# Patient Record
Sex: Male | Born: 1955 | Race: Black or African American | Hispanic: No | Marital: Married | Smoking: Former smoker
Health system: Southern US, Community
[De-identification: ages and names within clinical notes are randomized; demographics above are authoritative.]

## PROBLEM LIST (undated history)

## (undated) DIAGNOSIS — F32A Depression, unspecified: Secondary | ICD-10-CM

## (undated) DIAGNOSIS — I5022 Chronic systolic (congestive) heart failure: Secondary | ICD-10-CM

## (undated) DIAGNOSIS — E782 Mixed hyperlipidemia: Secondary | ICD-10-CM

## (undated) DIAGNOSIS — I10 Essential (primary) hypertension: Secondary | ICD-10-CM

## (undated) DIAGNOSIS — I509 Heart failure, unspecified: Secondary | ICD-10-CM

## (undated) DIAGNOSIS — E041 Nontoxic single thyroid nodule: Secondary | ICD-10-CM

## (undated) DIAGNOSIS — C9590 Leukemia, unspecified not having achieved remission: Secondary | ICD-10-CM

## (undated) DIAGNOSIS — I428 Other cardiomyopathies: Secondary | ICD-10-CM

## (undated) HISTORY — DX: Other cardiomyopathies: I42.8

## (undated) HISTORY — PX: BACK SURGERY: SHX140

## (undated) HISTORY — DX: Nontoxic single thyroid nodule: E04.1

## (undated) HISTORY — DX: Chronic systolic (congestive) heart failure: I50.22

## (undated) HISTORY — DX: Mixed hyperlipidemia: E78.2

---

## 2000-03-17 ENCOUNTER — Emergency Department (HOSPITAL_COMMUNITY): Admission: EM | Admit: 2000-03-17 | Discharge: 2000-03-18 | Payer: Self-pay | Admitting: Emergency Medicine

## 2000-03-17 ENCOUNTER — Encounter: Payer: Self-pay | Admitting: Emergency Medicine

## 2007-08-05 ENCOUNTER — Ambulatory Visit: Payer: Self-pay | Admitting: Infectious Diseases

## 2007-08-05 ENCOUNTER — Inpatient Hospital Stay (HOSPITAL_COMMUNITY): Admission: EM | Admit: 2007-08-05 | Discharge: 2007-08-08 | Payer: Self-pay | Admitting: Emergency Medicine

## 2007-08-06 ENCOUNTER — Encounter (INDEPENDENT_AMBULATORY_CARE_PROVIDER_SITE_OTHER): Payer: Self-pay | Admitting: Gastroenterology

## 2008-10-06 ENCOUNTER — Ambulatory Visit: Payer: Self-pay | Admitting: Cardiology

## 2008-10-07 ENCOUNTER — Inpatient Hospital Stay (HOSPITAL_COMMUNITY): Admission: EM | Admit: 2008-10-07 | Discharge: 2008-10-08 | Payer: Self-pay | Admitting: Emergency Medicine

## 2008-10-07 ENCOUNTER — Encounter (INDEPENDENT_AMBULATORY_CARE_PROVIDER_SITE_OTHER): Payer: Self-pay | Admitting: Internal Medicine

## 2008-10-08 ENCOUNTER — Emergency Department (HOSPITAL_COMMUNITY): Admission: EM | Admit: 2008-10-08 | Discharge: 2008-10-08 | Payer: Self-pay | Admitting: Emergency Medicine

## 2009-02-03 ENCOUNTER — Inpatient Hospital Stay (HOSPITAL_COMMUNITY): Admission: EM | Admit: 2009-02-03 | Discharge: 2009-02-14 | Payer: Self-pay | Admitting: Emergency Medicine

## 2009-02-09 ENCOUNTER — Encounter (INDEPENDENT_AMBULATORY_CARE_PROVIDER_SITE_OTHER): Payer: Self-pay | Admitting: General Surgery

## 2009-12-25 ENCOUNTER — Emergency Department (HOSPITAL_COMMUNITY): Admission: EM | Admit: 2009-12-25 | Discharge: 2009-12-25 | Payer: Self-pay | Admitting: Emergency Medicine

## 2010-10-01 LAB — CBC
Hemoglobin: 14.5 g/dL (ref 13.0–17.0)
MCHC: 34.4 g/dL (ref 30.0–36.0)
RBC: 4.63 MIL/uL (ref 4.22–5.81)
WBC: 9.4 10*3/uL (ref 4.0–10.5)

## 2010-10-01 LAB — URINALYSIS, ROUTINE W REFLEX MICROSCOPIC
Bilirubin Urine: NEGATIVE
Glucose, UA: NEGATIVE mg/dL
Hgb urine dipstick: NEGATIVE
Ketones, ur: NEGATIVE mg/dL
Protein, ur: NEGATIVE mg/dL
pH: 5.5 (ref 5.0–8.0)

## 2010-10-01 LAB — HEPATIC FUNCTION PANEL
ALT: 30 U/L (ref 0–53)
AST: 40 U/L — ABNORMAL HIGH (ref 0–37)
Albumin: 3.7 g/dL (ref 3.5–5.2)
Alkaline Phosphatase: 67 U/L (ref 39–117)
Bilirubin, Direct: 0.3 mg/dL (ref 0.0–0.3)
Total Bilirubin: 1 mg/dL (ref 0.3–1.2)

## 2010-10-01 LAB — BASIC METABOLIC PANEL
CO2: 27 mEq/L (ref 19–32)
Calcium: 9.3 mg/dL (ref 8.4–10.5)
Chloride: 102 mEq/L (ref 96–112)
Creatinine, Ser: 0.99 mg/dL (ref 0.4–1.5)
GFR calc Af Amer: >60 mL/min (ref >60)
Sodium: 135 mEq/L (ref 135–145)

## 2010-10-01 LAB — DIFFERENTIAL
Basophils Relative: 0 % (ref 0–1)
Lymphocytes Relative: 50 % — ABNORMAL HIGH (ref 12–46)
Lymphs Abs: 4.7 10*3/uL — ABNORMAL HIGH (ref 0.7–4.0)
Monocytes Absolute: 1.1 10*3/uL — ABNORMAL HIGH (ref 0.1–1.0)
Monocytes Relative: 12 % (ref 3–12)
Neutro Abs: 3.4 10*3/uL (ref 1.7–7.7)
Neutrophils Relative %: 36 % — ABNORMAL LOW (ref 43–77)

## 2010-10-20 LAB — CBC
HCT: 25 % — ABNORMAL LOW (ref 39.0–52.0)
HCT: 26.8 % — ABNORMAL LOW (ref 39.0–52.0)
HCT: 28.3 % — ABNORMAL LOW (ref 39.0–52.0)
Hemoglobin: 8.3 g/dL — ABNORMAL LOW (ref 13.0–17.0)
Hemoglobin: 9 g/dL — ABNORMAL LOW (ref 13.0–17.0)
MCHC: 33.5 g/dL (ref 30.0–36.0)
MCV: 87.2 fL (ref 78.0–100.0)
MCV: 87.8 fL (ref 78.0–100.0)
Platelets: 231 10*3/uL (ref 150–400)
Platelets: 299 10*3/uL (ref 150–400)
RBC: 2.87 MIL/uL — ABNORMAL LOW (ref 4.22–5.81)
RDW: 13.9 % (ref 11.5–15.5)
RDW: 14 % (ref 11.5–15.5)
RDW: 14.1 % (ref 11.5–15.5)

## 2010-10-20 LAB — DIFFERENTIAL
Basophils Absolute: 0 10*3/uL (ref 0.0–0.1)
Basophils Absolute: 0 10*3/uL (ref 0.0–0.1)
Basophils Absolute: 0 10*3/uL (ref 0.0–0.1)
Eosinophils Absolute: 0.2 10*3/uL (ref 0.0–0.7)
Eosinophils Absolute: 0.2 10*3/uL (ref 0.0–0.7)
Eosinophils Relative: 3 % (ref 0–5)
Eosinophils Relative: 3 % (ref 0–5)
Eosinophils Relative: 3 % (ref 0–5)
Lymphocytes Relative: 38 % (ref 12–46)
Lymphocytes Relative: 45 % (ref 12–46)
Lymphs Abs: 3.2 10*3/uL (ref 0.7–4.0)
Monocytes Absolute: 0.7 10*3/uL (ref 0.1–1.0)
Monocytes Absolute: 0.8 10*3/uL (ref 0.1–1.0)
Monocytes Absolute: 0.8 10*3/uL (ref 0.1–1.0)
Monocytes Relative: 11 % (ref 3–12)

## 2010-10-20 LAB — BASIC METABOLIC PANEL
GFR calc non Af Amer: >60 mL/min (ref >60)
Glucose, Bld: 116 mg/dL — ABNORMAL HIGH (ref 70–99)

## 2010-10-21 LAB — DIFFERENTIAL
Basophils Absolute: 0 10*3/uL (ref 0.0–0.1)
Basophils Absolute: 0 10*3/uL (ref 0.0–0.1)
Basophils Absolute: 0 10*3/uL (ref 0.0–0.1)
Basophils Absolute: 0 10*3/uL (ref 0.0–0.1)
Basophils Absolute: 0 10*3/uL (ref 0.0–0.1)
Basophils Relative: 0 % (ref 0–1)
Basophils Relative: 0 % (ref 0–1)
Basophils Relative: 0 % (ref 0–1)
Basophils Relative: 0 % (ref 0–1)
Eosinophils Absolute: 0.1 10*3/uL (ref 0.0–0.7)
Eosinophils Absolute: 0.1 10*3/uL (ref 0.0–0.7)
Eosinophils Absolute: 0.2 10*3/uL (ref 0.0–0.7)
Eosinophils Absolute: 0.2 10*3/uL (ref 0.0–0.7)
Eosinophils Absolute: 0.3 10*3/uL (ref 0.0–0.7)
Eosinophils Relative: 1 % (ref 0–5)
Eosinophils Relative: 1 % (ref 0–5)
Eosinophils Relative: 2 % (ref 0–5)
Eosinophils Relative: 3 % (ref 0–5)
Lymphocytes Relative: 25 % (ref 12–46)
Lymphocytes Relative: 26 % (ref 12–46)
Lymphocytes Relative: 39 % (ref 12–46)
Lymphocytes Relative: 44 % (ref 12–46)
Lymphocytes Relative: 57 % — ABNORMAL HIGH (ref 12–46)
Lymphs Abs: 2.4 10*3/uL (ref 0.7–4.0)
Lymphs Abs: 2.4 10*3/uL (ref 0.7–4.0)
Lymphs Abs: 3.3 10*3/uL (ref 0.7–4.0)
Lymphs Abs: 3.8 10*3/uL (ref 0.7–4.0)
Lymphs Abs: 4.6 10*3/uL — ABNORMAL HIGH (ref 0.7–4.0)
Monocytes Absolute: 0.7 10*3/uL (ref 0.1–1.0)
Monocytes Absolute: 1.1 10*3/uL — ABNORMAL HIGH (ref 0.1–1.0)
Monocytes Absolute: 1.1 10*3/uL — ABNORMAL HIGH (ref 0.1–1.0)
Monocytes Relative: 10 % (ref 3–12)
Monocytes Relative: 11 % (ref 3–12)
Monocytes Relative: 12 % (ref 3–12)
Monocytes Relative: 13 % — ABNORMAL HIGH (ref 3–12)
Neutro Abs: 3 10*3/uL (ref 1.7–7.7)
Neutro Abs: 3.4 10*3/uL (ref 1.7–7.7)
Neutro Abs: 5.9 10*3/uL (ref 1.7–7.7)
Neutrophils Relative %: 30 % — ABNORMAL LOW (ref 43–77)
Neutrophils Relative %: 31 % — ABNORMAL LOW (ref 43–77)
Neutrophils Relative %: 48 % (ref 43–77)
Neutrophils Relative %: 49 % (ref 43–77)

## 2010-10-21 LAB — CROSSMATCH
ABO/RH(D): O POS
ABO/RH(D): O POS
ABO/RH(D): O POS
Antibody Screen: NEGATIVE
Antibody Screen: NEGATIVE
Antibody Screen: NEGATIVE

## 2010-10-21 LAB — URINE CULTURE

## 2010-10-21 LAB — CBC
HCT: 19.5 % — ABNORMAL LOW (ref 39.0–52.0)
HCT: 19.7 % — ABNORMAL LOW (ref 39.0–52.0)
HCT: 23.5 % — ABNORMAL LOW (ref 39.0–52.0)
HCT: 24.3 % — ABNORMAL LOW (ref 39.0–52.0)
HCT: 24.7 % — ABNORMAL LOW (ref 39.0–52.0)
HCT: 25.2 % — ABNORMAL LOW (ref 39.0–52.0)
HCT: 27.2 % — ABNORMAL LOW (ref 39.0–52.0)
Hemoglobin: 6.7 g/dL — CL (ref 13.0–17.0)
Hemoglobin: 6.7 g/dL — CL (ref 13.0–17.0)
Hemoglobin: 7.8 g/dL — CL (ref 13.0–17.0)
Hemoglobin: 8.2 g/dL — ABNORMAL LOW (ref 13.0–17.0)
Hemoglobin: 8.4 g/dL — ABNORMAL LOW (ref 13.0–17.0)
Hemoglobin: 8.5 g/dL — ABNORMAL LOW (ref 13.0–17.0)
MCHC: 33.1 g/dL (ref 30.0–36.0)
MCHC: 33.2 g/dL (ref 30.0–36.0)
MCHC: 33.5 g/dL (ref 30.0–36.0)
MCHC: 33.7 g/dL (ref 30.0–36.0)
MCHC: 34 g/dL (ref 30.0–36.0)
MCV: 88 fL (ref 78.0–100.0)
MCV: 88.1 fL (ref 78.0–100.0)
MCV: 88.3 fL (ref 78.0–100.0)
MCV: 88.3 fL (ref 78.0–100.0)
MCV: 89 fL (ref 78.0–100.0)
MCV: 89 fL (ref 78.0–100.0)
MCV: 89.4 fL (ref 78.0–100.0)
MCV: 90.8 fL (ref 78.0–100.0)
Platelets: 171 10*3/uL (ref 150–400)
Platelets: 181 10*3/uL (ref 150–400)
Platelets: 192 10*3/uL (ref 150–400)
Platelets: 193 10*3/uL (ref 150–400)
Platelets: 204 10*3/uL (ref 150–400)
Platelets: 216 10*3/uL (ref 150–400)
Platelets: 226 10*3/uL (ref 150–400)
Platelets: 228 10*3/uL (ref 150–400)
RBC: 2.67 MIL/uL — ABNORMAL LOW (ref 4.22–5.81)
RBC: 2.72 MIL/uL — ABNORMAL LOW (ref 4.22–5.81)
RBC: 2.77 MIL/uL — ABNORMAL LOW (ref 4.22–5.81)
RBC: 2.85 MIL/uL — ABNORMAL LOW (ref 4.22–5.81)
RDW: 13.1 % (ref 11.5–15.5)
RDW: 13.2 % (ref 11.5–15.5)
RDW: 13.3 % (ref 11.5–15.5)
RDW: 13.7 % (ref 11.5–15.5)
RDW: 13.8 % (ref 11.5–15.5)
RDW: 13.9 % (ref 11.5–15.5)
RDW: 14.4 % (ref 11.5–15.5)
WBC: 11.1 10*3/uL — ABNORMAL HIGH (ref 4.0–10.5)
WBC: 7 10*3/uL (ref 4.0–10.5)
WBC: 7.3 10*3/uL (ref 4.0–10.5)
WBC: 7.7 10*3/uL (ref 4.0–10.5)
WBC: 8 10*3/uL (ref 4.0–10.5)
WBC: 8.3 10*3/uL (ref 4.0–10.5)
WBC: 8.4 10*3/uL (ref 4.0–10.5)
WBC: 8.6 10*3/uL (ref 4.0–10.5)
WBC: 9.2 10*3/uL (ref 4.0–10.5)
WBC: 9.7 10*3/uL (ref 4.0–10.5)

## 2010-10-21 LAB — APTT
aPTT: 31 seconds (ref 24–37)
aPTT: 33 seconds (ref 24–37)

## 2010-10-21 LAB — BASIC METABOLIC PANEL
BUN: 6 mg/dL (ref 6–23)
CO2: 28 mEq/L (ref 19–32)
Calcium: 7.6 mg/dL — ABNORMAL LOW (ref 8.4–10.5)
Chloride: 105 mEq/L (ref 96–112)
Creatinine, Ser: 1.16 mg/dL (ref 0.4–1.5)
Creatinine, Ser: 1.18 mg/dL (ref 0.4–1.5)
GFR calc Af Amer: >60 mL/min (ref >60)
GFR calc Af Amer: >60 mL/min (ref >60)
GFR calc non Af Amer: >60 mL/min (ref >60)

## 2010-10-21 LAB — COMPREHENSIVE METABOLIC PANEL
ALT: 15 U/L (ref 0–53)
AST: 29 U/L (ref 0–37)
Albumin: 2.5 g/dL — ABNORMAL LOW (ref 3.5–5.2)
Alkaline Phosphatase: 40 U/L (ref 39–117)
BUN: 3 mg/dL — ABNORMAL LOW (ref 6–23)
Chloride: 105 mEq/L (ref 96–112)
GFR calc Af Amer: >60 mL/min (ref >60)
Potassium: 4.1 mEq/L (ref 3.5–5.1)
Sodium: 138 mEq/L (ref 135–145)
Total Bilirubin: 0.7 mg/dL (ref 0.3–1.2)
Total Protein: 5 g/dL — ABNORMAL LOW (ref 6.0–8.3)

## 2010-10-21 LAB — POCT I-STAT, CHEM 8
BUN: 17 mg/dL (ref 6–23)
Calcium, Ion: 1.13 mmol/L (ref 1.12–1.32)
Creatinine, Ser: 1.3 mg/dL (ref 0.4–1.5)
Glucose, Bld: 132 mg/dL — ABNORMAL HIGH (ref 70–99)
Hemoglobin: 6.5 g/dL — CL (ref 13.0–17.0)
Sodium: 140 mEq/L (ref 135–145)
TCO2: 24 mmol/L (ref 0–100)

## 2010-10-21 LAB — BRAIN NATRIURETIC PEPTIDE: Pro B Natriuretic peptide (BNP): <30 pg/mL (ref 0.0–100.0)

## 2010-10-21 LAB — URINALYSIS, ROUTINE W REFLEX MICROSCOPIC
Bilirubin Urine: NEGATIVE
Glucose, UA: NEGATIVE mg/dL
Hgb urine dipstick: NEGATIVE
Hgb urine dipstick: NEGATIVE
Ketones, ur: NEGATIVE mg/dL
Ketones, ur: NEGATIVE mg/dL
Nitrite: NEGATIVE
pH: 5.5 (ref 5.0–8.0)
pH: 7 (ref 5.0–8.0)

## 2010-10-21 LAB — TSH: TSH: 1.016 u[IU]/mL (ref 0.350–4.500)

## 2010-10-21 LAB — POCT CARDIAC MARKERS
CKMB, poc: <1 ng/mL — ABNORMAL LOW (ref 1.0–8.0)
Troponin i, poc: <0.05 ng/mL (ref 0.00–0.09)
Troponin i, poc: <0.05 ng/mL (ref 0.00–0.09)

## 2010-10-21 LAB — HEMOGLOBIN AND HEMATOCRIT, BLOOD
HCT: 22.7 % — ABNORMAL LOW (ref 39.0–52.0)
HCT: 23.7 % — ABNORMAL LOW (ref 39.0–52.0)
HCT: 24.7 % — ABNORMAL LOW (ref 39.0–52.0)
HCT: 25.2 % — ABNORMAL LOW (ref 39.0–52.0)
HCT: 28.6 % — ABNORMAL LOW (ref 39.0–52.0)
Hemoglobin: 7.8 g/dL — CL (ref 13.0–17.0)
Hemoglobin: 9 g/dL — ABNORMAL LOW (ref 13.0–17.0)
Hemoglobin: 9.8 g/dL — ABNORMAL LOW (ref 13.0–17.0)

## 2010-10-21 LAB — CULTURE, BLOOD (ROUTINE X 2): Culture: NO GROWTH

## 2010-10-21 LAB — CARDIAC PANEL(CRET KIN+CKTOT+MB+TROPI)
CK, MB: 1.5 ng/mL (ref 0.3–4.0)
Total CK: 178 U/L (ref 7–232)
Total CK: 184 U/L (ref 7–232)
Troponin I: 0.02 ng/mL (ref 0.00–0.06)

## 2010-10-21 LAB — PREPARE RBC (CROSSMATCH)

## 2010-10-21 LAB — PROTIME-INR
INR: 1 (ref 0.00–1.49)
Prothrombin Time: 14 seconds (ref 11.6–15.2)

## 2010-10-21 LAB — PHOSPHORUS: Phosphorus: 3.8 mg/dL (ref 2.3–4.6)

## 2010-10-25 LAB — TROPONIN I: Troponin I: <0.01 ng/mL (ref 0.00–0.06)

## 2010-10-25 LAB — DIFFERENTIAL
Basophils Absolute: 0 10*3/uL (ref 0.0–0.1)
Basophils Relative: 1 % (ref 0–1)
Eosinophils Absolute: 0.4 10*3/uL (ref 0.0–0.7)
Eosinophils Relative: 4 % (ref 0–5)
Monocytes Absolute: 0.9 10*3/uL (ref 0.1–1.0)
Neutro Abs: 2.5 10*3/uL (ref 1.7–7.7)

## 2010-10-25 LAB — COMPREHENSIVE METABOLIC PANEL
ALT: 28 U/L (ref 0–53)
Albumin: 3.8 g/dL (ref 3.5–5.2)
Alkaline Phosphatase: 76 U/L (ref 39–117)
BUN: 13 mg/dL (ref 6–23)
Chloride: 103 mEq/L (ref 96–112)
Potassium: 3.6 mEq/L (ref 3.5–5.1)
Total Bilirubin: 1.1 mg/dL (ref 0.3–1.2)

## 2010-10-25 LAB — CARDIAC PANEL(CRET KIN+CKTOT+MB+TROPI)
Relative Index: 1.2 (ref 0.0–2.5)
Relative Index: 1.3 (ref 0.0–2.5)
Relative Index: 1.4 (ref 0.0–2.5)
Troponin I: <0.01 ng/mL (ref 0.00–0.06)
Troponin I: <0.01 ng/mL (ref 0.00–0.06)

## 2010-10-25 LAB — CK TOTAL AND CKMB (NOT AT ARMC): Relative Index: 1.4 (ref 0.0–2.5)

## 2010-10-25 LAB — PROTIME-INR: Prothrombin Time: 13.4 seconds (ref 11.6–15.2)

## 2010-10-25 LAB — APTT: aPTT: 34 seconds (ref 24–37)

## 2010-10-25 LAB — URINALYSIS, ROUTINE W REFLEX MICROSCOPIC
Bilirubin Urine: NEGATIVE
Glucose, UA: NEGATIVE mg/dL
Hgb urine dipstick: NEGATIVE
Specific Gravity, Urine: 1.023 (ref 1.005–1.030)
pH: 6.5 (ref 5.0–8.0)

## 2010-10-25 LAB — D-DIMER, QUANTITATIVE: D-Dimer, Quant: 0.58 ug/mL-FEU — ABNORMAL HIGH (ref 0.00–0.48)

## 2010-10-25 LAB — CBC
HCT: 44.6 % (ref 39.0–52.0)
Hemoglobin: 15.1 g/dL (ref 13.0–17.0)
Platelets: 219 10*3/uL (ref 150–400)
WBC: 9 10*3/uL (ref 4.0–10.5)

## 2010-10-25 LAB — TYPE AND SCREEN

## 2010-10-25 LAB — LIPID PANEL
Cholesterol: 166 mg/dL (ref 0–200)
HDL: 40 mg/dL (ref >39)
Triglycerides: 114 mg/dL (ref <150)

## 2010-10-25 LAB — TSH: TSH: 0.807 u[IU]/mL (ref 0.350–4.500)

## 2010-10-25 LAB — POCT CARDIAC MARKERS

## 2010-10-25 LAB — LIPASE, BLOOD: Lipase: 32 U/L (ref 11–59)

## 2010-11-27 NOTE — Group Therapy Note (Signed)
[name], [name]                 ACCOUNT NO.:  [id number]   MEDICAL RECORD NO.:  [id number]          PATIENT TYPE:  INP   LOCATION:  1610                         FACILITY:  [facility]   PHYSICIAN:  Elliot Cousin, M.D.    DATE OF BIRTH:  1956/01/22                                 PROGRESS NOTE   DATE OF DISCHARGE:  To be determined.   Please see the previous progress note summary dictated by Dr. Marthann Schiller.   CONSULTATIONS:  Jimmye Norman, MD   PROCEDURE PERFORMED:  1. Hemorrhoidectomy of the bleeding hemorrhoid and hemorrhoidal      banding at 11 and 4 o'clock positions.  Performed on February 09, 2009.  2. Nuclear medicine tagged red blood cell study performed on February 08, 2009.  The results revealed no gastrointestinal bleeding visualized      during the 2-hour observation time.  3. Chest x-ray on February 08, 2009.  The results revealed stable chest x-      ray.  No active lung disease.   CURRENT DIAGNOSES/HOSPITAL COURSE:  1. BLEEDING INTERNAL HEMORRHOIDS:  Status post hemorrhoidectomy; acute      blood loss anemia; status post a total of 8 units of packed red      blood cell transfusions.  The patient continued to be followed by      the general surgery team including Dr. Lindie Spruce and Dr. Jamey Ripa.  [facility] followed the patient from afar over the past few      days.  The patient continued to have active bleeding from his      internal hemorrhoids.  Following conservative treatment and      observation, Dr. Lindie Spruce decided to take the patient to the operating      room to perform a hemorrhoidectomy and banding.  During the      operation, Dr. Lindie Spruce noted that there was a right posterior      bleeding hemorrhoid at the 7 o'clock position with other internal      hemorrhoids at the 11 o'clock and 4 o'clock positions.  The patient      tolerated the operation well.  He was started on laxative therapy      to assist with treatment of chronic constipation.  He was  provided      with a hemorrhoidectomy home care instruction sheet.  His      hemoglobin and hematocrit continued to decrease over the past      several days.  On February 09, 2009, his hemoglobin was 7.9.  It was      repeated at 8.5.  However, on July 30th, it fell again to 8.0 and      then subsequently to 7.8.  Late last night on February 10, 2009, his      hemoglobin fell again to 7.3.  He was transfused several additional      units over the past 3 days.  His hemoglobin is currently 8.4.  His      hemoglobin and hematocrit will continue to  be followed.  In light      of subsiding rectal bleeding, hopefully, the patient's hemoglobin      and hematocrit will remain stable.  If his hemoglobin falls below      7.5 again, he will be transfused accordingly.  He is      hemodynamically stable currently.  2. FEBRILE ILLNESS AND MILD PYURIA:  Several days ago, the patient      became febrile.  A urinalysis was ordered and it revealed a trace      of leukocytes.  A chest x-ray was ordered as well and it revealed      no findings suggestive of pneumonia.  Blood cultures were ordered      and they have remained negative so far.  His urine culture      eventually grew out only 2000 colonies of bacteria.  He was started      empirically on Rocephin on February 08, 2009.  While on Rocephin, he      spiked another fever at 100.6.  Rocephin was therefore discontinued      and antibiotic therapy was broadened with Cefepime and vancomycin.      As of today, he has completed 2 days of Cefepime and vancomycin      treatment.  The patient's peripheral IV site was examined and there      appeared to be no obvious erythema.  Although it has not been      confirmed, a mild infection associated with the hemorrhoids could      be a potential source of infection.  Currently, he is afebrile and      his white blood cell count is within normal limits.  If he remains      afebrile over the next 48 hours, the antibiotics can  certainly be      narrowed.  3. CHRONIC SYSTOLIC CONGESTIVE HEART FAILURE:  The patient was      maintained on his chronic cardiac medications although the dose of      Coreg and lisinopril were decreased.  He was treated with      intravenous Lasix throughout the hospitalization, given the      multiple transfusions.  Lasix will be transitioned to p.o. in the      morning.  A recent chest x-ray revealed no signs of pulmonary      edema.  His chronic congestive heart failure remained compensated.  4. 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  Therapy has been ordered.   DISPOSITION:  The patient is currently stabilizing.  His hemoglobin and hematocrit  will need to be monitored closely, as does evaluation of ongoing  hemorrhoidal bleeding.  Currently there are no signs of hemorrhoidal  bleeding.  His hemoglobin is currently 8.4.  A CBC has been ordered  daily.  If he remains afebrile over the next 48 hours and if his  hemoglobin remains stable over the next 48 hours, he could be discharged  home.   FOLLOWUP:  He will need to follow-up with his primary care physician at the [facility] in  [facility] and with Dr. Lindie Spruce as recommended in 2 weeks following discharge.      Elliot Cousin, M.D.  Electronically Signed     DF/MEDQ  D:  02/11/2009  T:  02/11/2009  Job:  [id number]  cc:   Fayrene Fearing L. Malon Kindle., M.D.  Fax: [phone]   Cherylynn Ridges, M.D.  [address], [address]  [place]  [place] [address]
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
## 2010-11-27 NOTE — Discharge Summary (Signed)
[name], [name]                 ACCOUNT NO.:  [id number]   MEDICAL RECORD NO.:  [id number]          PATIENT TYPE:  INP   LOCATION:  5504                         FACILITY:  [facility]   PHYSICIAN:  Ruthy Dick, MD    DATE OF BIRTH:  30-Sep-1955   DATE OF ADMISSION:  10/06/2008  DATE OF DISCHARGE:  10/08/2008                               DISCHARGE SUMMARY   REASON FOR ADMISSION:  Chest pain.   FINAL DISCHARGE DIAGNOSES:  1. Chest pain, probably atypical.  2. Nonsustained ventricular tachycardia.  3. Nonischemic cardiomyopathy with ejection fraction 40%.  4. Dyslipidemia.  5. Chronic back pain, on methadone.  6. History of hepatitis B.  7. Benign prostatic hypertrophy.  8. Depression.  9. Narcotic dependence.  10.Epigastric pain resolved.   CONSULT DURING THIS ADMISSION:  Cardiology consult.   PROCEDURE DONE DURING THIS ADMISSION:  Left heart catheterization, which  showed normal coronary arteries with ejection fraction estimated at 40%.   BRIEF HISTORY OF PRESENT ILLNESS AND HOSPITAL COURSE:  This is a 55-year-  old African American male who goes to the [facility] for his medical care  and came to the hospital with chest pain and some epigastric pain as  well.  He was admitted and cardiac enzymes were drawn.  Cardiac enzymes  were negative.  However, the patient continued to have chest pain and  even at some point had a nonsustained ventricular tachycardia.  Because  of this Cardiology, we are invited into his care and the patient ended  up having a left heart catheterization with the above-noted findings.  The recommendation is that the patient needs electrophysiology study,  which will help to evaluate his cardiomyopathy further.  The patient has  declined having the procedures done here and wants to follow up at the  [facility] for further workup.  He seems to be a reliable person and he  says he has an appointment on Monday to see doctor at the [facility].  We have  advised him to keep this appointment and to inform them of his  heart condition and that he needs to see the cardiologist over at the [facility].  He has done today tremendously well.  His chest pain is  resolved.  His epigastric pain is resolved.  No nausea and no vomiting  today.  No diarrhea, no constipation, no fever, and no chills.  He has  no dysuria, no frequency, no urgency.   PHYSICAL EXAMINATION:  VITAL SIGNS:  Today are stable with temperature  98.4, pulse 74, respiration 18, blood pressure 105/72, and saturating  99% on room air.  CHEST:  Clear to auscultation bilaterally.  ABDOMEN:  Soft and nontender.  EXTREMITIES:  No clubbing, no cyanosis, and no edema.  CARDIOVASCULAR:  First and second heart sounds only.   The patient is to go home on his Pravachol at his home dose daily and  methadone 10 mg b.i.d. at his home dose as well.  In addition to that he  is to be on aspirin 81 mg p.o. daily, Coreg 6.25 mg p.o. b.i.d., and  lisinopril 10 mg p.o. daily.   As already noted, it is recommended that the patient have  electrophysiology study to evaluate his cardiomyopathy further and  according to him, at this point, his preference would be at the [facility].   TIME USED FOR DISCHARGE PLANNING:  Greater than 30 minutes.      Ruthy Dick, MD  Electronically Signed     GU/MEDQ  D:  10/08/2008  T:  10/09/2008  Job:  [id number]   cc:   [facility]

## 2010-11-27 NOTE — H&P (Signed)
[name], [name]                 ACCOUNT NO.:  [id number]   MEDICAL RECORD NO.:  [id number]          PATIENT TYPE:  EMS   LOCATION:  MAJO                         FACILITY:  [facility]   PHYSICIAN:  Vania Rea, M.D. DATE OF BIRTH:  06/10/1956   DATE OF ADMISSION:  10/07/2008  DATE OF DISCHARGE:                              HISTORY & PHYSICAL   PRIMARY CARE PHYSICIAN:  [facility] team at [facility].   CHIEF COMPLAINT:  1. Chest pain and pressure radiating down the left arm, intermittent      for the past 2 weeks.  2. Right upper quadrant pain radiating to the right scapula.   HISTORY OF PRESENT ILLNESS:  This is an obese 55 year old African  American gentleman with a history of chronic back pain, maintained on  methadone and a history of depression on no pharmacological therapy who  was at his baseline until the past 2 weeks when he started having  episodic chest pressure radiating to the left arm.  He says it is  aggravated by lying at rest, is relieved by walking around.  It is also  relieved by eating.  It is associated with nausea and lightheadedness.  He is also been having right upper quadrant pain radiating to right  shoulder which is more severe.  The patient says he has never had a  cardiac workup.  Is puzzled by the lightheadedness and dizziness.   He denies fever, cough or cold.  He denies orthopnea, PND, lower  extremity edema.  He does suffer from arthritis with pain in multiple  joints and chronic back pain status post back surgery.  Initially, he  said he felt the symptoms may be due to a side effects of his methadone.  He tried reducing dose of his methadone, the problem persisted, and as a  result, he decided to come to the emergency room.  Additionally, the  patient says he is concerned because his father had a history of stomach  cancer and his brother was recently diagnosed with a stage IV cancer of  the prostate, and he decided he better get these symptoms  checked out.  The patient did receive nitroglycerin in the emergency room which  reportedly relieved the pain.  He has had a CT angiogram of the chest  which is negative for acute pulmonary embolus, and he had an ultrasound  of the right upper quadrant which revealed a contracted gallbladder with  no obvious evidence of inflammation or stone.   PAST MEDICAL HISTORY:  1. History of bright red blood per rectum confirmed to be piled on      colonoscopy in January 2009 by Dr. Loreta Ave.  2. Normal upper endoscopy in January 2009.  3. Remote history of alcohol and polysubstance abuse over 30 years      ago.  4. History of depression.  5. History of hepatitis B.  6. Hyperlipidemia.   MEDICATIONS:  1. Methadone 50 mg daily.  2. Pravachol unknown dose daily.  3. Takes his brother's sleeping pills, has been taking a few one every  night for the past few nights.  Does not know the name of them.   ALLERGIES:  NO KNOWN DRUG ALLERGIES.   SOCIAL HISTORY:  Denies any current tobacco, alcohol or illicit drug  use.  Remote history of polysubstance abuse.  Lives with his wife.  He  has knowledge of a Engineer, structural, and he is [facility] connected.   FAMILY HISTORY:  Significant for father who had a history of stomach  cancer.  Does not know much about them otherwise.  Mother who was  alcohol addicted and had cirrhosis, was also hypertensive.  Brother  recently diagnosed with stage IV CA of the prostate that noted above.  Another brother with diabetes, end-stage renal disease on dialysis,  coronary artery disease.   REVIEW OF SYSTEMS:  A 10-point review of systems is significant for  insomnia, refractory, however, of his eyes, nausea, bleeding from  hemorrhoids and constipation as noted above.  Chronic pain in his knees,  back, elbows, arms, wrists, shoulders.  He has no history of stroke.  He  has no history of lower extremity edema.  Other than this, a 10-point  review of systems is  unremarkable.   PHYSICAL EXAMINATION:  GENERAL:  An obese middle-aged African American  gentleman sitting up on the couch, complaining of headaches since the  placement of nitroglycerin.  VITALS:  Temperature is 98.3, pulse 64, respirations 17, blood pressure  118/79, saturating at 99% on 2 liters.  HEENT:  His pupils have a slight proptosis.  Pupils are round equal and  reactive.  Mucous membranes pink and anicteric.  Has no cervical  lymphadenopathy or thyromegaly.  CHEST:  Clear to auscultation bilaterally.  CARDIOVASCULAR:  Regular rhythm without murmur.  ABDOMEN:  Obese, soft and there is no tenderness.  EXTREMITIES:  He has no edema, 2+ dorsalis pedis pulses bilaterally.  CENTRAL NERVOUS SYSTEM: Cranial nerves II-XII are grossly intact.  He  has no focal neurologic deficit.   LABORATORY DATA:  CBC has been reviewed by me and is completely normal.  His complete metabolic panel also reviewed by me is completely normal.  His urinalysis is significant only for specific gravity of 1.023, but  otherwise normal without any abnormal constituents.  His cardiac enzymes  are completely normal with undetectable troponins.  Two-view chest x-ray  shows minimal streaky right basilar atelectasis, but no infiltrate,  edema or effusions.  A 12-lead EKG shows normal sinus rhythm without any  ST-segment changes.   ASSESSMENT:  1. Atypical chest pain in an obese, middle-aged [place] American      gentleman with history of hyperlipidemia.  Remote history of      tobacco and substance abuse and no history of cardiac evaluation.  2. Epigastric and right scapular pain with nonconclusive ultrasound.  3. History of hyperlipidemia.  4. History of depression.  5. History of severe chronic pain syndrome on chronic methadone.   PLAN:  We will bring this gentleman on observation for serial cardiac  enzymes and will get the benefit of cardiology evaluation in view of his  symptoms of nausea and  lightheadedness, and he may benefit from cardiac  stress testing to assess his risk of coronary artery disease.     Vania Rea, M.D.  Electronically Signed    LC/MEDQ  D:  10/07/2008  T:  10/07/2008  Job:  [id number]   cc:   [facility]

## 2010-11-27 NOTE — Cardiovascular Report (Signed)
NAMERAYNE, LOISEAU                 ACCOUNT NO.:  [id number]   MEDICAL RECORD NO.:  [id number]          PATIENT TYPE:  INP   LOCATION:  5504                         FACILITY:  [facility]   PHYSICIAN:  Antonieta Iba, MD   DATE OF BIRTH:  26-Aug-1955   DATE OF PROCEDURE:  10/07/2008  DATE OF DISCHARGE:                            CARDIAC CATHETERIZATION   REASON FOR PROCEDURE:  Mr. Chichester is a 55 year old gentleman with history  of chronic back pain, on methadone; hepatitis C secondary to blood  transfusion who presented with 2 days of dizziness, fatigue, substernal  chest pain, and shortness of breath.  While he was an inpatient, he was  noted to have nonsustained VT of 12 beats.  He is brought to the Cardiac  Catheterization Lab for further evaluation of his coronary anatomy and  his LV function.  Echocardiogram is pending.   The risks and benefits of the procedure were explained to the patient by  Dr. Sheliah Mends.  Consent was obtained.  The patient was prepped and  draped in the usual sterile fashion in the Cath Lab, and the modified  Seldinger technique was used to engage the right femoral artery with a 5-  [place] introducer sheath.  A 5-French Judkins left #4 and right #4  catheter were used to engage the left main and right coronary artery  ostium respectively.  Hand injection of contrast was used to visualize  the coronary anatomy with cinematography.  A pigtail catheter was used  to cross the aortic valve into the left ventricle and LV gram was  recorded and pullback of the catheter across the aortic valve recorded  aortic pressures.  At the end of the case, the catheter and sheath were  removed and manual pressure maintained until hemostasis was obtained.  No complications were reported at the end the case at the time of this  dictation.   PROCEDURE DETAILS:   CORONARY ANATOMY:  Left main; left main is a moderate-to-large-sized  vessel that bifurcates into the LAD and the left  circumflex.  There is  no significant disease noted.   Left anterior descending; the LAD is a moderate-to-large-sized vessel  that wraps around the apex.  There are several diagonal branches with a  moderate-sized D-1 and D-2.  There is no significant disease noted.   The left circumflex; left circumflex is a moderate-sized vessel with one  moderate-sized obtuse marginal branch that takes off from the mid region  also with an AV groove circumflex.  There is no significant disease  noted.   Right coronary artery; the RCA is a right dominant vessel that has a  moderate-sized PDA and PL branch.  There is no significant disease  noted.   LV gram documents a mild-to-moderately depressed global hypokinesis with  estimated ejection fraction of 40%.  No aortic stenosis noted.  No  significant mitral regurgitation noted.   FINAL IMPRESSION:  Right dominant coronary system with no significant  coronary artery disease.  Nonischemic cardiomyopathy with ejection  fraction estimated at 40% based on right anterior oblique views.  There  is  no  significant aortic stenosis or mitral regurgitation noted.  Echocardiogram and further recommendations are pending.  Etiology of the  patient's nonsustained ventricular tachycardia likely due to his  underlying nonischemic cardiomyopathy and further evaluation and  management will be needed.      Antonieta Iba, MD  Electronically Signed     TJG/MEDQ  D:  10/07/2008  T:  10/08/2008  Job:  [id number]
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
## 2010-11-27 NOTE — Op Note (Signed)
[name], [name]                 ACCOUNT NO.:  [id number]   MEDICAL RECORD NO.:  [id number]          PATIENT TYPE:  INP   LOCATION:  6702                         FACILITY:  [facility]   PHYSICIAN:  [name]., M.D.DATE OF BIRTH:  12-18-1955   DATE OF PROCEDURE:  02/06/2009  DATE OF DISCHARGE:                               OPERATIVE REPORT   PROCEDURE:  Colonoscopy.   MEDICATIONS:  Fentanyl 125 mcg, Versed 12.5 mg, and Phenergan 12.5 mg.   INDICATIONS:  Rectal bleeding in a gentleman who sees his primary care  at [facility].  He is requiring transfusion.   DESCRIPTION OF PROCEDURE:  The procedure has been explained to the  patient and consent was obtained.  In the left lateral decubitus  position, the Pentax pediatric colonoscope was inserted.  The patient  had large internal and external hemorrhoids and once we passed this  rectum was entered.  There was no blood, whatsoever, throughout the  entire colon.  There was no diverticular disease.  He had a somewhat  tortuous colon and was somewhat uncooperative due to inability of sedate  him from his history of narcotic use.  After giving Phenergan, we were  able to get him adequately sedated and advanced to the cecum.  Appendiceal orifice and ileocecal valve were seen.  No AVMs in the cecum  and the colon was examined carefully upon withdrawal.  No polyps,  diverticuli, AVMs, or any signs of bleeding, whatsoever.  In the rectum,  I was unable to retroflex due to poor cooperation and agitation, but  large internal hemorrhoids seen in the anal canal.  The scope was  withdrawn.  The patient tolerated the procedure well.   ASSESSMENT:  Rectal bleeding, probably due to internal hemorrhoids.  No  other source is seen.   PLAN:  We will start him on MiraLax, treat for hemorrhoids, and follow  him clinically.           ______________________________  Llana Aliment Malon [name]., M.D.     Waldron Session  D:  02/06/2009  T:  02/06/2009   Job:  [id number]

## 2010-11-27 NOTE — Op Note (Signed)
[name], [name]                 ACCOUNT NO.:  [id number]   MEDICAL RECORD NO.:  [id number]          PATIENT TYPE:  INP   LOCATION:  6962                         FACILITY:  [facility]   PHYSICIAN:  Cherylynn Ridges, M.D.    DATE OF BIRTH:  [birth year]   DATE OF PROCEDURE:  02/09/2009  DATE OF DISCHARGE:                               OPERATIVE REPORT   PREOPERATIVE DIAGNOSIS:  Bleeding, internal hemorrhoids with drop in  hemoglobin.   POSTOPERATIVE DIAGNOSIS:  Right posterior bleeding hemorrhoid at the 7  o'clock position with other internal hemorrhoids at 11 o'clock and 4  o'clock.   PROCEDURES:  1. Hemorrhoidectomy of the bleeding hemorrhoid.  2. Hemorrhoidal banding at 11 and 4 o'clock.   SURGEON:  Cherylynn Ridges, MD   ANESTHESIA:  General endotracheal.  It is done in lithotomy position  with PAS also in place.   COMPLICATIONS:  None.   CONDITION:  Stable.   FINDINGS:  Upon exploring the anal area exam under anesthesia, we saw  acute bleeding from right posterior hemorrhoid at the 7 o'clock position  with 12 o'clock being directly anterior.   OPERATION:  The patient was taken to the operating room, placed on table  initially in supine position.  After an adequate general endotracheal  anesthetic was administered, he was placed in lithotomy and then prepped  in usual sterile manner.   An anoscope was passed and looked at circumferentially where he had  multiple internal hemorrhoids, large hemorrhoids.  The only one that was  acutely bleeding was in the posterolateral area on the right side at  approximately 7 o'clock position.  This one was excised with a locking  normal chromic stitch mucosal full-thickness stitch from the internal  port to externally.  We left a small opening externally for drainage.  We reinforced the bleeding area with figure-of-eight stitches of 3-0  chromic.  Once this was done, two rubber bands were placed on  hemorrhoids at the 11 o'clock and the 4  o'clock position.  All counts  were correct.  We irrigated and then placed a piece of Gelfoam with  dibucaine ointment covering it into the anal canal.  Dressing was  applied.      Cherylynn Ridges, M.D.  Electronically Signed    JOW/MEDQ  D:  02/09/2009  T:  02/10/2009  Job:  [id number]

## 2010-11-27 NOTE — Group Therapy Note (Signed)
[name], [name]                 ACCOUNT NO.:  [id number]   MEDICAL RECORD NO.:  [id number]          PATIENT TYPE:  INP   LOCATION:  2956                         FACILITY:  [facility]   PHYSICIAN:  Altha Harm, MDDATE OF BIRTH:  12-01-1955                                 PROGRESS NOTE   DISCHARGE DIAGNOSES:  1. Bleeding internal hemorrhoids.  2. Acute blood loss anemia.  3. Chronic systolic dysfunction.  4. Chronic back pain.  5. Hypertension.  6. History of hepatitis C.  7. Benign prostatic hypertrophy.   DISCHARGE MEDICATIONS:  Can be determined at the time of discharge.   CONSULTATIONS:  1. [facility].  2. [facility].   PROCEDURES:  1. Status post colonoscopy on February 06, 2009 which shows internal      hemorrhoids.  No bleeding and no diverticulosis.  2. Status post transfusion of 5 units of packed red blood cells.   DIAGNOSTIC STUDIES:  1. A 2-view chest x-ray which shows a normal chest.  2. CT abdomen and pelvis which shows no actively relevant pathology in      the abdomen.  A 2.8-cm simple cyst in the left kidney, lumbar spine      stenosis at L3-4.  Impression:  No significant findings in the      pelvis.  No cause of bleeding identifiable.   ALLERGIES:  PENICILLIN.   PRIMARY CARE PHYSICIAN[facility] [place].   CHIEF COMPLAINT:  Bleeding times several days and weakness.   HISTORY OF PRESENT ILLNESS:  Please refer to the H and P by Dr. Della Goo for details of the  HPI.   HOSPITAL COURSE:  1. Rectal bleeding and acute blood loss anemia.  The patient reports a      1-week history of rectal bleeding with each stool.  He describes      the blood as bright red blood per rectum.  The patient states that      approximately 2 days prior to admission, he started feeling weak      and having pain globally.  The patient presented to the emergency      room and was found to have a hemoglobin of 7.5 with symptoms.  The      patient was  transfused initially 3 units of packed red blood cells      and a GI consult was obtained.  The patient underwent colonoscopy      with findings as noted above.  However, the patient has continued      to have active bleeding with hemoglobin back down to 7.7 today      after a total of 5 units of packed red blood cells transfused.  I      have consulted [facility] to see the patient for      possible injection versus ligation of the hemorrhoids.  The consult      is pending and the patient has not yet been seen by [facility].  2. Chronic systolic dysfunction.  This has been stable since      hospitalization.  The patient has had no clinical signs.  The      patient did initially complain of chest pain and this was ruled out      with serial enzymes for any ischemia.  3. Chronic back pain.  The patient is resuming his usual medications      for pain at home.  4. Hypertension.  Well controlled.  5. Benign prostatic hypertrophy.   Currently the patient is on a clear liquid diet.  I will advance the  patient to full liquids today if tolerated.  This brings [place] up to date  on the patient's hospital course.      Altha Harm, MD  Electronically Signed     MAM/MEDQ  D:  02/07/2009  T:  02/07/2009  Job:  [id number]   cc:   Dr. Randa Evens

## 2010-11-30 NOTE — Discharge Summary (Signed)
[name], HOHENSEE                 ACCOUNT NO.:  [id number]   MEDICAL RECORD NO.:  [id number]          PATIENT TYPE:  INP   LOCATION:  5033                         FACILITY:  [facility]   PHYSICIAN:  Zara Council, MD      DATE OF BIRTH:  11-19-1955   DATE OF ADMISSION:  08/05/2007  DATE OF DISCHARGE:  08/08/2007                               DISCHARGE SUMMARY   PRIMARY CARE PHYSICIAN:  [facility], [place].   DISCHARGE DIAGNOSES:  1. Bleeding per rectum secondary to hemorrhoids.  2. Abdominal pain, nausea, and vomiting of unknown etiology likely      secondary to viral gastroenteritis.  3. History of hepatitis C infection.  4. Chronic back pain, on methadone.  5. History of polysubstance abuse including heroin and marijuana.  6. History of tobacco abuse.  7. History of depression.   DISCHARGE MEDICATIONS:  1. Methadone 10 mg 1 tablet 3 times a day.  2. Tylenol No. 3 one tablet three times a day.  3. Lipitor 20 mg once daily.  4. Protonix 40 mg once daily.   Note: Patient left the hospital without taking discharge instructions.   DISPOSITION AND FOLLOWUP:  Patient is to follow with his primary care  physician at [facility] in [place]. Patient is to be reviewed for  resolution of his problem of rectal bleeding.  Patient also needs long-  term followup in terms of chronic back pain and hepatitis C infection.   PROCEDURE:  CT abdomen and pelvis with contrast, August 05, 2007.   CONCLUSION:  1. No acute finding in the abdomen or finding to explain the patient's      pain with a small left renal cyst.  No acute finding in the pelvis.  2. Chest x-ray, August 05, 2007.   IMPRESSION:  Low lung volumes without acute finding.   Colonoscopy on August 06, 2007.  Findings:  A 5 mm sessile polyp in  transverse colon, 1 cm pedunculated polyp in the ascending colon that  was hot snared.  Cecum and terminal ileum  normal.  A 5 mm sessile polyp  in descending colon that was  cold snared and normal sigmoid colon.  Rectum, unable to perform because of full retroflexion, please note that  the procedure was very difficult to perform because the patient was not  only sedated and was extremely combative during the procedure.  Biopsy  reports are ordered.   EGD exam done on August 07, 2007.  Findings:  Normal esophagus, normal  duodenum, and normal stomach.   Flexible sigmoidoscopy on August 07, 2007, normal sigmoid colon and  rectum, internal and external hemorrhoids.   CONSULTS:  Gastroenterology, Dr. Charna Elizabeth.   ADMISSION HISTORY:  [name] is a 55 year old gentleman with hepatitic C  infection, history of alcohol abuse, and chronic back pain for which he  uses methadone and Tylenol, but not on NSAIDs.  He presented to ED with  1 episode of bleeding per rectum.  This happened around 1 o'clock in the  morning on the day of presentation.  He passed about one pint of  dark  blood.  He had another episode after coming to the ED where he passed  about one pint of blood noted by ED physician to be bright red colored.   The patient gave history of noticing streaks of bright red blood on his  toilet paper for the past 1 month prior to presentation.  He had also  been having vague abdominal discomfort for 1 month prior to  presentation, which had worsened for 2 days prior to admission.  His  abdominal pain was localized on the right upper quadrant.  It was non-  radiating off and on, dull sensation, 9/10 at its worst, and was  associated with nausea.  He vomited once and brought up only food  particles without any blood.   PHYSICAL EXAMINATION:  GENERAL:  NAD.  VITAL SIGNS:  Temperature 97.5, blood pressure 147/95, pulse 98,  respiratory rate 20, and oxygen saturation is 99% on room air.  HEENT:  Eyes:  No icterus.  No pallor.  EOMI.  PERRLA.  ENT:  Moist  mucous membrane.  NECK:  Supple with no thyromegaly.  CHEST:  Clear to auscultation bilaterally.   CARDIOVASCULAR:  Regular rate and rhythm.  Normal heart sounds.  No  murmurs, rubs, or gallops.  ABDOMEN:  Soft, mild distention, mildly tender over the right upper  quadrant, negative for rebound or guarding.  No fluid thrill or shifting  dullness.  No palpable organomegaly or mass.  Normal bowel sounds.  EXTREMITIES:  1+ edema.  No calf swelling.  GENITOURINARY:  No CVA tenderness.  RECTAL:  Positive for tenderness and clots.  No mass.  No fissure.  No  hemorrhoids.  SKIN:  No rash.  Normal turgor.  LYMPH:  No lymphadenopathy.  MUSCULOSKELETAL:  Spine:  No underlying tenderness or swelling.  NEURO:  Alert and oriented x3.  Cranial nerves II through XII intact.  No focal, motor, or sensory deficit, although he complained of vague  numbness down his right leg.  PSYCH: Examination was appropriate.   ADMISSION LABS:  Sodium 135, potassium 4.3, chloride 100, and  bicarbonate 27.  BUN 12 and creatinine 0.98.  Blood glucose 119.  Hemoglobin 14, white cell 9.3, ANC 4.2, platelet 221,000, MCV 88.7.  Bilirubin 1, alkaline phosphatase 79, ALT 33, AST 44, protein 8.1,  albumin 3.9, and calcium 9.2.  FOBT is positive.  BNP is less than 30.  Urinalysis was NAD.  Lipase 32.  PT 13.1, INR 1, and APTT 34.   HOSPITAL COURSE:  1. Rectal bleeding.  Given the patient's history of having streaks on      toilet paper, this was most likely thought to be a lower GI source,      although we could not initially rule out an upper GI source because      of the patient's history of hepatitis C infection and alcohol      abuse.  We admitted the patient for observation and he was placed      on telemetry floor with regular monitoring of his blood pressure.      We placed him on intravenous fluids and monitored his H&H every 8      hours.  He did not have any significant episodes while in the      hospital, although he did continue to pass clots.  We made a      gastroenterological consultation.  He was seen by  Dr. Loreta Ave on whose      recommendation the patient  subsequently underwent upper and lower      GI endoscopic procedure, the result of which are as mentioned      above.  The patient was recommended to take high-fiber diet.  The      plan was to consult the sources if bleeding continued.  The      patient's symptoms were subsiding on the day of discharge.   1. Pain in abdomen, nausea, and vomiting.  This was of unclear      etiology.  Initially, we thought it could be related to some acute      abdominal process given the patient's history of alcohol and      polysubstance abuse history, but his lipase was within normal      limits and also the CT scan of the abdomen was noncontributory.      This was most likely secondary to some acute viral process.  We      only gave him supportive care in terms of this particular problem      of his.   1. Hepatitis C infection.  The patient's liver function as well as PT      and INR were within normal limits.  The plan was to touch base with      the patient's primary care physician about further care.   1. Polysubstance abuse.  The patient claimed to not actively abusing      any of the illicit drugs.  We checked an UDS, which was negative,      except for methadone and opioids, which the patient is currently      taking as prescribed.   DISCHARGE VITALS:  Temperature 97.5, blood pressure 111/70, pulse 84,  respiratory 18, and oxygen saturation is 99% on room air.   DISCHARGE LABS:  Hemoglobin 11.4, white cell 5.3, platelet 176,000.  Sodium 140, potassium 3.6, chloride 106, and bicarbonate 29.  BUN 8 and  creatinine 0.89.  Blood glucose 121.      Zara Council, MD  Electronically Signed     AS/MEDQ  D:  08/11/2007  T:  08/12/2007  Job:  [id number]

## 2010-11-30 NOTE — Discharge Summary (Signed)
NAMECAYETANO, [name]                 ACCOUNT NO.:  [id number]   MEDICAL RECORD NO.:  [id number]          PATIENT TYPE:  INP   LOCATION:  6702                         FACILITY:  [facility]   PHYSICIAN:  Theodosia Paling, MD    DATE OF BIRTH:  27-Jan-1956   DATE OF ADMISSION:  02/03/2009  DATE OF DISCHARGE:  02/14/2009                               DISCHARGE SUMMARY   PRIMARY CARE PHYSICIAN:  The patient follows at [facility] in  [place].   ADMITTING HISTORY:  Please refer to the excellent admission note  dictated by Dr. Della Goo under history of present illness.   INTERIM HOSPITAL COURSE:  Please refer to the excellent discharge  summary dictated by Dr. Marthann Schiller on February 07, 2009, and of Dr.  Elliot Cousin on February 11, 2009.   DISCHARGE DIAGNOSES:  1. Bleeding, internal hemorrhoids status post hemorrhoidectomy.  2. Anemia likely due to bleeding.  3. Chronic systolic congestive heart failure, remained compensated.  4. Chronic low back pain.   DISCHARGE MEDICATIONS:  1. Coreg 3.125 mg p.o. daily 12 hours.  2. Lisinopril 5 mg p.o. daily.  3. Methadone 10 mg p.o. q.12 h.  4. Senokot 1 tablet p.o. nightly.  5. Anusol-HC suppository q.8 h.  6. Percocet 5/325 mg p.o. q.6 h p.r.n.   HOME MEDICATIONS STOPPED:  1. Lisinopril 20 mg p.o. daily.  2. Coreg 12.5 mg p.o. nightly.  3. Morphine sulfate unknown dose.   MEDICATION TO BE CONTINUED:  Pravastatin 10 mg p.o. daily.   HOSPITAL COURSE:  Please refer to the discharge summary dictated by Dr.  Marthann Schiller on February 07, 2009 and February 11, 2009.  Since then, no  new events have transpired except for the patient had some minor  spotting after the surgery.  The patient was worried about that.  Surgery was reconsulted, who recommended for the patient to discharged  to home and they felt that small oozing will happen secondary to  banding.  Stool softener needed to be continued.  If the bleeding occurs  before his scheduled appointment with Surgery as an outpatient, he needs  to report to them earlier.   DISPOSITION:  The patient will follow up with Dr. Lindie Spruce in 2 weeks'  time.  He is to call for appointment at [phone].  The patient will  follow up at [facility].  He will call for appointment within 1 week's  time.  If the patient bleeds, has the rectal bleed again, he needs to  call Dr. Lindie Spruce for appointment prior to his scheduled appointment.   Total time spent in discharge of this patient around 45 minutes.  No  further imaging procedure or consultation was performed since February 11, 2009.      Theodosia Paling, MD  Electronically Signed     NP/MEDQ  D:  02/16/2009  T:  02/17/2009  Job:  [id number]   cc:   Cherylynn Ridges, M.D.  [facility]

## 2011-04-05 LAB — URINALYSIS, ROUTINE W REFLEX MICROSCOPIC
Bilirubin Urine: NEGATIVE
Glucose, UA: NEGATIVE
Hgb urine dipstick: NEGATIVE
Ketones, ur: NEGATIVE
Nitrite: NEGATIVE
Protein, ur: NEGATIVE
Specific Gravity, Urine: 1.019
Urobilinogen, UA: 1
pH: 6

## 2011-04-05 LAB — CBC
HCT: 32.7 — ABNORMAL LOW
HCT: 34.1 — ABNORMAL LOW
HCT: 40.7
HCT: 41.3
HCT: 41.8
HCT: 43.5
Hemoglobin: 10.8 — ABNORMAL LOW
Hemoglobin: 11 — ABNORMAL LOW
Hemoglobin: 11 — ABNORMAL LOW
Hemoglobin: 11.1 — ABNORMAL LOW
Hemoglobin: 11.4 — ABNORMAL LOW
Hemoglobin: 13.6
Hemoglobin: 13.7
Hemoglobin: 14
MCHC: 33.3
MCHC: 33.4
MCHC: 33.7
MCHC: 34
MCV: 88.7
MCV: 88.8
Platelets: 171
Platelets: 173
Platelets: 176
Platelets: 177
Platelets: 221
Platelets: 228
RBC: 3.72 — ABNORMAL LOW
RBC: 3.75 — ABNORMAL LOW
RBC: 4.54
RBC: 4.6
RBC: 4.72
RDW: 12.5
RDW: 12.6
RDW: 12.6
RDW: 12.7
RDW: 12.7
RDW: 12.8
RDW: 12.9
RDW: 13
WBC: 5.1
WBC: 5.3
WBC: 6.2
WBC: 8.1
WBC: 8.8
WBC: 9.3

## 2011-04-05 LAB — URINE DRUGS OF ABUSE SCREEN W ALC, ROUTINE (REF LAB)
Barbiturate Quant, Ur: NEGATIVE
Cocaine Metabolites: NEGATIVE
Creatinine,U: 150.2
Ethyl Alcohol: <10
Opiate Screen, Urine: POSITIVE — AB
Phencyclidine (PCP): NEGATIVE
Propoxyphene: NEGATIVE

## 2011-04-05 LAB — DIFFERENTIAL
Basophils Absolute: 0
Basophils Absolute: 0
Basophils Relative: 0
Eosinophils Absolute: 0.2
Eosinophils Relative: 2
Eosinophils Relative: 3
Lymphocytes Relative: 42
Lymphocytes Relative: 46
Lymphs Abs: 3.9
Lymphs Abs: 4
Monocytes Absolute: 0.9
Monocytes Relative: 10
Neutro Abs: 3.6
Neutro Abs: 4.2
Neutrophils Relative %: 46

## 2011-04-05 LAB — COMPREHENSIVE METABOLIC PANEL WITH GFR
BUN: 12
CO2: 27
Calcium: 9.2
Creatinine, Ser: 0.98
GFR calc non Af Amer: >60
Glucose, Bld: 119 — ABNORMAL HIGH
Total Protein: 8.1

## 2011-04-05 LAB — BASIC METABOLIC PANEL
BUN: 5 — ABNORMAL LOW
CO2: 26
CO2: 29
Calcium: 8.3 — ABNORMAL LOW
Calcium: 9
Chloride: 106
Chloride: 107
Creatinine, Ser: 0.89
GFR calc Af Amer: >60
GFR calc Af Amer: >60
GFR calc non Af Amer: >60
Glucose, Bld: 132 — ABNORMAL HIGH
Glucose, Bld: 98
Potassium: 3.7
Sodium: 138
Sodium: 140
Sodium: 140

## 2011-04-05 LAB — OPIATE, QUANTITATIVE, URINE
Hydrocodone: NEGATIVE ng/mL
Hydromorphone GC/MS Conf: 120 ng/mL
Oxycodone, ur: 110 ng/mL
Oxymorphone: 100 ng/mL

## 2011-04-05 LAB — CROSSMATCH
ABO/RH(D): O POS
Antibody Screen: NEGATIVE

## 2011-04-05 LAB — TROPONIN I: Troponin I: 0.01

## 2011-04-05 LAB — COMPREHENSIVE METABOLIC PANEL
ALT: 33
AST: 44 — ABNORMAL HIGH
Albumin: 3.9
Alkaline Phosphatase: 79
Chloride: 100
GFR calc Af Amer: >60
Potassium: 4.3
Sodium: 135
Total Bilirubin: 1

## 2011-04-05 LAB — PROTIME-INR
INR: 1
Prothrombin Time: 13.1

## 2011-04-05 LAB — ABO/RH: ABO/RH(D): O POS

## 2011-04-05 LAB — LIPASE, BLOOD: Lipase: 23

## 2011-04-05 LAB — HCV RNA QUANT: HCV Quantitative: 8780000 — ABNORMAL HIGH (ref <5)

## 2011-04-05 LAB — B-NATRIURETIC PEPTIDE (CONVERTED LAB): Pro B Natriuretic peptide (BNP): <30

## 2011-04-05 LAB — OCCULT BLOOD X 1 CARD TO LAB, STOOL: Fecal Occult Bld: POSITIVE

## 2011-04-05 LAB — APTT: aPTT: 34

## 2018-10-09 ENCOUNTER — Other Ambulatory Visit: Payer: Self-pay

## 2018-10-09 ENCOUNTER — Emergency Department (HOSPITAL_COMMUNITY)
Admission: EM | Admit: 2018-10-09 | Discharge: 2018-10-10 | Disposition: A | Discharge status: Other Acute Inpatient Hospital | Payer: Medicare Other | Attending: Emergency Medicine | Admitting: Emergency Medicine

## 2018-10-09 DIAGNOSIS — R059 Cough, unspecified: Secondary | ICD-10-CM

## 2018-10-09 DIAGNOSIS — R45851 Suicidal ideations: Secondary | ICD-10-CM | POA: Insufficient documentation

## 2018-10-09 DIAGNOSIS — Z79899 Other long term (current) drug therapy: Secondary | ICD-10-CM | POA: Insufficient documentation

## 2018-10-09 DIAGNOSIS — F332 Major depressive disorder, recurrent severe without psychotic features: Secondary | ICD-10-CM | POA: Insufficient documentation

## 2018-10-09 DIAGNOSIS — M5441 Lumbago with sciatica, right side: Secondary | ICD-10-CM | POA: Insufficient documentation

## 2018-10-09 DIAGNOSIS — Z7982 Long term (current) use of aspirin: Secondary | ICD-10-CM | POA: Diagnosis not present

## 2018-10-09 DIAGNOSIS — R05 Cough: Secondary | ICD-10-CM | POA: Insufficient documentation

## 2018-10-09 DIAGNOSIS — M545 Low back pain: Secondary | ICD-10-CM | POA: Diagnosis present

## 2018-10-09 LAB — COMPREHENSIVE METABOLIC PANEL
ALK PHOS: 59 U/L (ref 38–126)
ALT: 15 U/L (ref 0–44)
ANION GAP: 11 (ref 5–15)
AST: 23 U/L (ref 15–41)
Albumin: 4.2 g/dL (ref 3.5–5.0)
BILIRUBIN TOTAL: 1 mg/dL (ref 0.3–1.2)
BUN: 15 mg/dL (ref 8–23)
CALCIUM: 10 mg/dL (ref 8.9–10.3)
CO2: 25 mmol/L (ref 22–32)
CREATININE: 1.29 mg/dL — AB (ref 0.61–1.24)
Chloride: 102 mmol/L (ref 98–111)
GFR calc non Af Amer: 58 mL/min — ABNORMAL LOW (ref >60)
Glucose, Bld: 147 mg/dL — ABNORMAL HIGH (ref 70–99)
Potassium: 4.2 mmol/L (ref 3.5–5.1)
Sodium: 138 mmol/L (ref 135–145)
TOTAL PROTEIN: 7.9 g/dL (ref 6.5–8.1)

## 2018-10-09 LAB — CBC WITH DIFFERENTIAL/PLATELET
Abs Immature Granulocytes: 0.04 10*3/uL (ref 0.00–0.07)
BASOS ABS: 0 10*3/uL (ref 0.0–0.1)
Basophils Relative: 0 %
EOS ABS: 0 10*3/uL (ref 0.0–0.5)
EOS PCT: 0 %
HCT: 47.5 % (ref 39.0–52.0)
Hemoglobin: 15.2 g/dL (ref 13.0–17.0)
Immature Granulocytes: 0 %
Lymphocytes Relative: 32 %
Lymphs Abs: 3.1 10*3/uL (ref 0.7–4.0)
MCH: 29.1 pg (ref 26.0–34.0)
MCHC: 32 g/dL (ref 30.0–36.0)
MCV: 90.8 fL (ref 80.0–100.0)
MONO ABS: 0.3 10*3/uL (ref 0.1–1.0)
Monocytes Relative: 3 %
NRBC: 0 % (ref 0.0–0.2)
Neutro Abs: 6.4 10*3/uL (ref 1.7–7.7)
Neutrophils Relative %: 65 %
Platelets: 269 10*3/uL (ref 150–400)
RBC: 5.23 MIL/uL (ref 4.22–5.81)
RDW: 12 % (ref 11.5–15.5)
WBC: 9.8 10*3/uL (ref 4.0–10.5)

## 2018-10-09 LAB — ETHANOL: Alcohol, Ethyl (B): <10 mg/dL (ref <10)

## 2018-10-09 LAB — SALICYLATE LEVEL

## 2018-10-09 LAB — ACETAMINOPHEN LEVEL: Acetaminophen (Tylenol), Serum: <10 ug/mL — ABNORMAL LOW (ref 10–30)

## 2018-10-09 MED ORDER — ONDANSETRON HCL 4 MG/2ML IJ SOLN
4.0000 mg | Freq: Once | INTRAMUSCULAR | Status: AC
  Administered 2018-10-09: 4 mg via INTRAVENOUS
  Filled 2018-10-09: qty 2

## 2018-10-09 NOTE — ED Triage Notes (Signed)
Pt here by EMS for back pain. Hx of lumbar fusion and having pain in the same area now. 257mcg fentanyl given by EMS. A&Ox4, ambulatory with pain in triage.

## 2018-10-09 NOTE — ED Provider Notes (Signed)
[facility] EMERGENCY DEPARTMENT Provider Note   CSN: [id number] Arrival date & time: 10/09/18  2015    History   Chief Complaint Chief Complaint  Patient presents with  . Back Pain    HPI [name] is a 63 y.o. male with a hx of Hyperthyroid, CHF, chronic back pain presents to the Emergency Department complaining of acute, persistent, progressively worsening right sided low back pain onset around 2:30 this afternoon.  Pt reports he accidentally knocked over a heavy table and reached to right it before falling, straining his back.  Pt reports pain radiates into his right thigh and groin.  Pt reports baseline numbness in his right lateral thigh from previous L4-L5 fusion and bone graft obtained from his right hip approx 30 years ago. Pt reports no new numbness.  He reports baseline urinary leakage and denies increase in this or urinary retention.  Pt reports he previously took Methadone and Morphine for his back but is currently just taking Tylenol.  Pt reports taking Tylenol and using lidocaine patches without relief.  He has associated nausea.  Movement and palpation make his symptoms worse.  Pt denies hematuria, dysuria, urinary frequency.  Pt reports he is traveling here to see his wife and daughter, coming from [place].  He reports a week of subjective fevers and cough. He has had associated intermittent vomiting, but reports he has been eating chips without vomiting today.  He reports some shortness of breath with coughing.  No known sick contacts.  Pt denies neck pain, neck stiffness, chest pain, abd pain, weakness, dizziness, syncope.      Pt also c/o feeling suicidal.  He reports his daughter was murdered several months ago and he was hospitalized for SI approx 6 mos ago.  Pt reports he has felt suicidal for the last 3 days with a plan to overdose on his medications.  Pt denies HI, AVH.  He denies previous SI attempt.  Pt reports his SI worsened tonight after his back  pain got worse.     The history is provided by the patient and medical records. No language interpreter was used.     Home Medications    Prior to Admission medications   Medication Sig Start Date End Date Taking? Authorizing Provider  acetaminophen (TYLENOL) 500 MG tablet Take 1,000 mg by mouth every 4 (four) hours as needed for mild pain.   Yes [provider]  aspirin EC 81 MG tablet Take 81 mg by mouth daily.   Yes [provider]  CARVEDILOL PO Take 1 tablet by mouth 2 (two) times daily.   Yes [provider]  diphenhydrAMINE (BENADRYL) 25 MG tablet Take 25 mg by mouth every 6 (six) hours as needed for allergies.   Yes [provider]  SPIRONOLACTONE PO Take 1 tablet by mouth daily.   Yes [provider]    Family History No family history on file.  Social History Social History   Tobacco Use  . Smoking status: Not on file  Substance Use Topics  . Alcohol use: Not on file  . Drug use: Not on file     Allergies   Penicillins   Review of Systems Review of Systems  Constitutional: Positive for fever ( subjective). Negative for appetite change, diaphoresis, fatigue and unexpected weight change.  HENT: Negative for mouth sores.   Eyes: Negative for visual disturbance.  Respiratory: Positive for cough and shortness of breath. Negative for chest tightness and wheezing.  Cardiovascular: Negative for chest pain.  Gastrointestinal: Positive for nausea and vomiting ( interimttent). Negative for abdominal pain, constipation and diarrhea.  Endocrine: Negative for polydipsia, polyphagia and polyuria.  Genitourinary: Negative for dysuria, frequency, hematuria and urgency.  Musculoskeletal: Positive for back pain. Negative for neck stiffness.  Skin: Negative for rash.  Allergic/Immunologic: Negative for immunocompromised state.  Neurological: Negative for syncope and light-headedness.  Hematological: Does not bruise/bleed easily.   Psychiatric/Behavioral: Positive for dysphoric mood and suicidal ideas. Negative for hallucinations and sleep disturbance. The patient is not nervous/anxious.      Physical Exam Updated Vital Signs BP 135/60 (BP Location: Right Arm)   Pulse (!) 59   Temp 98.4 F (36.9 C) (Oral)   Resp 18   SpO2 97%   Physical Exam Vitals signs and nursing note reviewed.  Constitutional:      General: He is not in acute distress.    Appearance: He is well-developed. He is not diaphoretic.     Comments: Awake, alert, nontoxic appearance  HENT:     Head: Normocephalic and atraumatic.     Mouth/Throat:     Mouth: Mucous membranes are moist.     Comments: Moist mucous membranes Eyes:     General: No scleral icterus.    Extraocular Movements: Extraocular movements intact.     Conjunctiva/sclera:     Right eye: Right conjunctiva is injected.     Left eye: Left conjunctiva is injected.  Neck:     Musculoskeletal: Normal range of motion and neck supple.     Comments: Full ROM without pain Cardiovascular:     Rate and Rhythm: Normal rate and regular rhythm.  Pulmonary:     Effort: Pulmonary effort is normal. No respiratory distress.     Breath sounds: Normal breath sounds. No wheezing, rhonchi or rales.  Chest:     Chest wall: No tenderness.  Abdominal:     General: Bowel sounds are normal. There is no distension.     Palpations: Abdomen is soft. There is no mass or pulsatile mass.     Tenderness: There is no abdominal tenderness. There is no right CVA tenderness, left CVA tenderness, guarding or rebound.  Musculoskeletal: Normal range of motion.     Comments: Pt able to sit, stand and walk without assistance or difficulty. No midline tenderness to the T-spine or L-spine.  Midline surgical scar from mid T-spine to the base of the L-spine.  Pt reports only fusion at L4-L5. TTP along the right SI joint, right paraspinal muscles and right buttock  Lymphadenopathy:     Cervical: No cervical  adenopathy.  Skin:    General: Skin is warm and dry.     Findings: No erythema or rash.  Neurological:     Mental Status: He is alert.     Comments: Speech is clear and goal oriented, follows commands Normal 5/5 strength in upper and lower extremities bilaterally including dorsiflexion and plantar flexion, strong and equal grip strength Sensation normal to light and sharp touch Moves extremities without ataxia, coordination intact Antalgic gait, no foot drop Normal balance No Clonus  Psychiatric:        Attention and Perception: He does not perceive auditory or visual hallucinations.        Speech: Speech normal.        Behavior: Behavior normal.        Thought Content: Thought content includes suicidal ideation. Thought content does not include homicidal ideation. Thought content includes suicidal plan. Thought content  does not include homicidal plan.      ED Treatments / Results  Labs (all labs ordered are listed, but only abnormal results are displayed) Labs Reviewed  COMPREHENSIVE METABOLIC PANEL - Abnormal; Notable for the following components:      Result Value   Glucose, Bld 147 (*)    Creatinine, Ser 1.29 (*)    GFR calc non Af Amer 58 (*)    All other components within normal limits  ACETAMINOPHEN LEVEL - Abnormal; Notable for the following components:   Acetaminophen (Tylenol), Serum <10 (*)    All other components within normal limits  ETHANOL  CBC WITH DIFFERENTIAL/PLATELET  SALICYLATE LEVEL  RAPID URINE DRUG SCREEN, HOSP PERFORMED  URINALYSIS, ROUTINE W REFLEX MICROSCOPIC     Radiology Dg Chest 2 View  Result Date: 10/10/2018 CLINICAL DATA:  Back pain EXAM: CHEST - 2 VIEW COMPARISON:  None. FINDINGS: The heart size and mediastinal contours are within normal limits. Both lungs are clear. The visualized skeletal structures are unremarkable. IMPRESSION: No acute abnormality of the lungs.  No focal airspace opacity. Electronically Signed   By: Eddie Candle M.D.    On: 10/10/2018 00:49   Dg Lumbar Spine Complete  Result Date: 10/10/2018 CLINICAL DATA:  Back pain EXAM: LUMBAR SPINE - COMPLETE 4+ VIEW COMPARISON:  None. FINDINGS: No fracture or dislocation of the lumbar spine. Status post L4 through S1 posterior fusion. Moderate multilevel disc space height loss and osteophytosis. Moderate multilevel facet degenerative change. IMPRESSION: No fracture or dislocation of the lumbar spine. Status post L4 through S1 posterior fusion. Moderate multilevel disc space height loss and osteophytosis. Moderate multilevel facet degenerative change. Electronically Signed   By: Eddie Candle M.D.   On: 10/10/2018 00:52    Procedures Procedures (including critical care time)  Medications Ordered in ED Medications  alum & mag hydroxide-simeth (MAALOX/MYLANTA) 200-200-20 MG/5ML suspension 30 mL (has no administration in time range)  acetaminophen (TYLENOL) tablet 650 mg (has no administration in time range)  ondansetron (ZOFRAN) injection 4 mg (4 mg Intravenous Given 10/09/18 2350)     Initial Impression / Assessment and Plan / ED Course  I have reviewed the triage vital signs and the nursing notes.  Pertinent labs & imaging results that were available during my care of the patient were reviewed by me and considered in my medical decision making (see chart for details).        Pt presents with multiple complaints.  Pt's back pain appears to be MSK in nature.  No hx of AAA, no palpable abd mass or abd pain.  Back pain worse with movement and palpation.  No red flags for back pain.    Labs reassuring.  Creatinine elevated from previous in 2011 at 1.2 today.  Suspect progression of disease as opposed to AKI.  No elevation of Acetaminophen or Salicylate and patient denies overdose on any medication.     Pt also with c/o cough and IFI last week.  He is well appearing, without respiratory distress.  Vitals are within normal limits; afebrile, no hypoxia, no cough while I  was in the room, no respiratory distress.  CXR without pneumonia. Doubt active COVID infection.    Pt also with c/o SI with a plan.  At this time there is no condition which requires medical intervention.  TTS consulted.  Home medications ordered, however list is incomplete and pt is unable to remember most of his medications.    1:56 AM Discussed with Marijean Bravo of [facility]  who reports patient meets inpatient criteria.    The patient was discussed with and seen by Dr. Roxanne Mins who agrees with the treatment plan.    Final Clinical Impressions(s) / ED Diagnoses   Final diagnoses:  Right-sided low back pain with right-sided sciatica, unspecified chronicity  Cough  Suicidal ideation    ED Discharge Orders    None       Myesha Stillion, Gwenlyn Perking 49/20/10 0712    Delora Fuel, MD 19/75/88 [phone]

## 2018-10-09 NOTE — ED Notes (Signed)
Pt changed into purple scrubs, belongings bagged and placed in locker 6. Cell phone placed in valuables folder and given to security.

## 2018-10-09 NOTE — ED Notes (Signed)
Pt seen eating chips from vending machine

## 2018-10-09 NOTE — ED Notes (Signed)
Pt getting undressed and getting into hospital gown at this moment.

## 2018-10-10 ENCOUNTER — Emergency Department (HOSPITAL_COMMUNITY): Payer: Medicare Other

## 2018-10-10 LAB — RAPID URINE DRUG SCREEN, HOSP PERFORMED
Amphetamines: NOT DETECTED
BENZODIAZEPINES: NOT DETECTED
Barbiturates: NOT DETECTED
Cocaine: NOT DETECTED
OPIATES: NOT DETECTED
Tetrahydrocannabinol: NOT DETECTED

## 2018-10-10 LAB — URINALYSIS, ROUTINE W REFLEX MICROSCOPIC
BILIRUBIN URINE: NEGATIVE
Glucose, UA: NEGATIVE mg/dL
Hgb urine dipstick: NEGATIVE
Ketones, ur: NEGATIVE mg/dL
Leukocytes,Ua: NEGATIVE
Nitrite: NEGATIVE
PH: 7 (ref 5.0–8.0)
Protein, ur: NEGATIVE mg/dL
SPECIFIC GRAVITY, URINE: 1.025 (ref 1.005–1.030)

## 2018-10-10 MED ORDER — ASPIRIN EC 81 MG PO TBEC
81.0000 mg | DELAYED_RELEASE_TABLET | Freq: Every day | ORAL | Status: DC
  Administered 2018-10-10: 81 mg via ORAL
  Filled 2018-10-10: qty 1

## 2018-10-10 MED ORDER — ACETAMINOPHEN 325 MG PO TABS
650.0000 mg | ORAL_TABLET | ORAL | Status: DC | PRN
  Administered 2018-10-10 (×3): 650 mg via ORAL
  Filled 2018-10-10 (×3): qty 2

## 2018-10-10 MED ORDER — ALUM & MAG HYDROXIDE-SIMETH 200-200-20 MG/5ML PO SUSP: 30.0000 mL | Freq: Four times a day (QID) | ORAL | Status: DC | PRN

## 2018-10-10 MED ORDER — METHOCARBAMOL 500 MG PO TABS
500.0000 mg | ORAL_TABLET | Freq: Two times a day (BID) | ORAL | Status: DC | PRN
  Administered 2018-10-10 (×2): 500 mg via ORAL
  Filled 2018-10-10 (×2): qty 1

## 2018-10-10 NOTE — ED Notes (Signed)
Pt aware has been accepted to [facility]. Pt decided and chose to go to [facility].

## 2018-10-10 NOTE — ED Notes (Signed)
Pt asked to shower - supplies given - Pt states will wait until later.

## 2018-10-10 NOTE — Progress Notes (Signed)
Pt. meets criteria for inpatient treatment per Lindon Romp, NP.  No appropriate beds available at [facility]. Referred out to the following hospitals:   [facility]     [facility]     [facility]     [facility]    Disposition CSW will continue to follow for placement.  Areatha Keas. Judi Cong, MSW, [place] Disposition Clinical Social Work [phone] (cell) [phone] (office)

## 2018-10-10 NOTE — ED Notes (Signed)
Pt in shower.  

## 2018-10-10 NOTE — Progress Notes (Signed)
Pt accepted to [facility]  in [place], [place]  is the accepting/attending provider.  Call report to [phone]  [address] [facility] Psych ED notified.   Pt is Voluntary.  Pt may be transported by [facility]  Pt scheduled  to arrive at [facility] at or after 15:00  Romie Minus T. Judi Cong, MSW, [place] Disposition Clinical Social Work [phone] (cell) [phone] (office)
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
## 2018-10-10 NOTE — ED Notes (Signed)
Pt taken to restroom in a wheelchair. Pt also given new socks and brief.

## 2018-10-10 NOTE — ED Notes (Signed)
Pt given lunch tray.

## 2018-10-10 NOTE — ED Notes (Signed)
bfast tray ordered 

## 2018-10-10 NOTE — ED Notes (Signed)
ALL belongings - 2 labeled belongings bags and 2 valuables envelopes - [facility] - Pt aware.

## 2018-10-10 NOTE — ED Provider Notes (Signed)
Patient transferred to [facility] for mental health care.  Accepted by Dr. Marylou Mccoy.  Stable upon my evaluation prior to discharge.   Lennice Sites, DO 10/10/18 1557

## 2020-06-19 ENCOUNTER — Emergency Department: Payer: No Typology Code available for payment source

## 2020-06-19 ENCOUNTER — Other Ambulatory Visit: Payer: Self-pay

## 2020-06-19 ENCOUNTER — Emergency Department
Admission: EM | Admit: 2020-06-19 | Discharge: 2020-06-19 | Disposition: A | Discharge status: Home / Self Care | Payer: No Typology Code available for payment source | Attending: Emergency Medicine | Admitting: Emergency Medicine

## 2020-06-19 ENCOUNTER — Encounter: Payer: Self-pay | Admitting: Emergency Medicine

## 2020-06-19 DIAGNOSIS — I509 Heart failure, unspecified: Secondary | ICD-10-CM | POA: Insufficient documentation

## 2020-06-19 DIAGNOSIS — I110 Hypertensive heart disease with heart failure: Secondary | ICD-10-CM | POA: Diagnosis not present

## 2020-06-19 DIAGNOSIS — Z79899 Other long term (current) drug therapy: Secondary | ICD-10-CM | POA: Diagnosis not present

## 2020-06-19 DIAGNOSIS — Z7982 Long term (current) use of aspirin: Secondary | ICD-10-CM | POA: Insufficient documentation

## 2020-06-19 DIAGNOSIS — Z87891 Personal history of nicotine dependence: Secondary | ICD-10-CM | POA: Insufficient documentation

## 2020-06-19 DIAGNOSIS — M545 Low back pain, unspecified: Secondary | ICD-10-CM | POA: Diagnosis present

## 2020-06-19 DIAGNOSIS — M5416 Radiculopathy, lumbar region: Secondary | ICD-10-CM | POA: Diagnosis not present

## 2020-06-19 HISTORY — DX: Heart failure, unspecified: I50.9

## 2020-06-19 HISTORY — DX: Essential (primary) hypertension: I10

## 2020-06-19 HISTORY — DX: Depression, unspecified: F32.A

## 2020-06-19 HISTORY — DX: Leukemia, unspecified not having achieved remission: C95.90

## 2020-06-19 MED ORDER — LIDOCAINE 5 % EX PTCH: 1.0000 | MEDICATED_PATCH | Freq: Two times a day (BID) | CUTANEOUS | 0 refills | Status: AC

## 2020-06-19 MED ORDER — PREDNISONE 50 MG PO TABS: ORAL_TABLET | ORAL | 0 refills | Status: DC

## 2020-06-19 MED ORDER — OXYCODONE-ACETAMINOPHEN 5-325 MG PO TABS: 1.0000 | ORAL_TABLET | ORAL | 0 refills | Status: AC | PRN

## 2020-06-19 MED ORDER — OXYCODONE-ACETAMINOPHEN 5-325 MG PO TABS
1.0000 | ORAL_TABLET | Freq: Once | ORAL | Status: AC
  Administered 2020-06-19: 1 via ORAL
  Filled 2020-06-19: qty 1

## 2020-06-19 NOTE — ED Triage Notes (Signed)
Patient to ER for c/o lower back pain with pain to right hip and shooting pain down right leg.

## 2020-06-19 NOTE — ED Provider Notes (Signed)
[facility] Emergency Department Provider Note  ____________________________________________  Time seen: Approximately 1:14 PM  I have reviewed the triage vital signs and the nursing notes.   HISTORY  Chief Complaint Back Pain    HPI [name] is a 64 y.o. male that presents to the emergency department for evaluation of right-sided low back pain that radiates into the side of right thigh for 6 days.  He states that the side of his thigh feels burning. Symptoms do not radiate into his lower leg. No trauma.  Patient had a spinal fusion in the past.  No bowel or bladder dysfunction or saddle anesthesias.  No fever, abdominal pain.  He is on daily muscle relaxers already.  He is unable to take anti-inflammatories due to his CHF.  Past Medical History:  Diagnosis Date  . CHF (congestive heart failure) ([place])   . Depression   . Hypertension   . Leukemia ([place])     There are no problems to display for this patient.   Past Surgical History:  Procedure Laterality Date  . BACK SURGERY      Prior to Admission medications   Medication Sig Start Date End Date Taking? Authorizing Provider  acetaminophen (TYLENOL) 500 MG tablet Take 1,000 mg by mouth every 4 (four) hours as needed for mild pain.   Yes [provider]  aspirin EC 81 MG tablet Take 81 mg by mouth daily.   Yes [provider]  atorvastatin (LIPITOR) 80 MG tablet Take 80 mg by mouth daily.   Yes [provider]  CARVEDILOL PO Take 1 tablet by mouth 2 (two) times daily.   Yes [provider]  diclofenac Sodium (VOLTAREN) 1 % GEL Apply topically 4 (four) times daily.   Yes [provider]  diphenhydrAMINE (BENADRYL) 25 MG tablet Take 25 mg by mouth every 6 (six) hours as needed for allergies.   Yes [provider]  DULoxetine (CYMBALTA) 60 MG capsule Take 60 mg by mouth daily.   Yes [provider]  gabapentin (NEURONTIN) 600 MG tablet Take  600 mg by mouth 3 (three) times daily.   Yes [provider]  hydrochlorothiazide (HYDRODIURIL) 25 MG tablet Take 25 mg by mouth daily.   Yes [provider]  hydrOXYzine (VISTARIL) 50 MG capsule Take 50 mg by mouth 3 (three) times daily as needed.   Yes [provider]  lisinopril (ZESTRIL) 20 MG tablet Take 20 mg by mouth daily.   Yes [provider]  Melatonin 5 MG CAPS Take 5 mg by mouth at bedtime as needed.   Yes [provider]  methacholine (PROVOCHOLINE) 100 MG inhaler solution Inhale into the lungs once.   Yes [provider]  methocarbamol (ROBAXIN) 750 MG tablet Take 750 mg by mouth every 8 (eight) hours as needed for muscle spasms.   Yes [provider]  SPIRONOLACTONE PO Take 1 tablet by mouth daily.   Yes [provider]  lidocaine (LIDODERM) 5 % Place 1 patch onto the skin every 12 (twelve) hours. Remove & Discard patch within 12 hours or as directed by MD 06/19/20 06/19/21  Laban Emperor, PA-C  oxyCODONE-acetaminophen (PERCOCET) 5-325 MG tablet Take 1 tablet by mouth every 4 (four) hours as needed for severe pain. 06/19/20 06/19/21  Laban Emperor, PA-C  predniSONE (DELTASONE) 50 MG tablet Take 1 tablet per day 06/19/20   Laban Emperor, PA-C    Allergies Penicillins  No family history on file.  Social History  Social History   Tobacco Use  . Smoking status: Former Research scientist (life sciences)  . Smokeless tobacco: Never Used  Substance Use Topics  . Alcohol use: Never  . Drug use: Not on file     Review of Systems  Constitutional: No fever/chills Cardiovascular: No chest pain. Respiratory: No SOB. Gastrointestinal: No abdominal pain.  No nausea, no vomiting.  Musculoskeletal: Positive for low back pain. Skin: Negative for rash, abrasions, lacerations, ecchymosis. Neurological: Negative for headaches, numbness or tingling   ____________________________________________   PHYSICAL EXAM:  VITAL SIGNS: ED Triage  Vitals  Enc Vitals Group     BP 06/19/20 1012 122/66     Pulse Rate 06/19/20 1012 72     Resp 06/19/20 1012 16     Temp 06/19/20 1012 99.5 F (37.5 C)     Temp Source 06/19/20 1012 Oral     SpO2 06/19/20 1012 99 %     Weight 06/19/20 1019 234 lb (106.1 kg)     Height 06/19/20 1019 5\' 9"  (1.753 m)     Head Circumference --      Peak Flow --      Pain Score 06/19/20 1027 10     Pain Loc --      Pain Edu? --      Excl. in [place]? --      Constitutional: Alert and oriented. Well appearing and in no acute distress. Eyes: Conjunctivae are normal. PERRL. EOMI. Head: Atraumatic. ENT:      Ears:      Nose: No congestion/rhinnorhea.      Mouth/Throat: Mucous membranes are moist.  Neck: No stridor. Cardiovascular: Normal rate, regular rhythm.  Good peripheral circulation. Respiratory: Normal respiratory effort without tachypnea or retractions. Lungs CTAB. Good air entry to the bases with no decreased or absent breath sounds. Gastrointestinal: Bowel sounds 4 quadrants. Soft and nontender to palpation. No guarding or rigidity. No palpable masses. No distention.  Musculoskeletal: Full range of motion to all extremities. No gross deformities appreciated.  Tenderness to palpation to right lumbar paraspinal muscles.  Full range of motion of right hip.  Strength in lower extremities bilaterally.  Antalgic gait. Neurologic:  Normal speech and language. No gross focal neurologic deficits are appreciated.  Skin:  Skin is warm, dry and intact. No rash noted. Psychiatric: Mood and affect are normal. Speech and behavior are normal. Patient exhibits appropriate insight and judgement.   ____________________________________________   LABS (all labs ordered are listed, but only abnormal results are displayed)  Labs Reviewed - No data to display ____________________________________________  EKG   ____________________________________________  RADIOLOGY Robinette Haines, personally viewed and  evaluated these images (plain radiographs) as part of my medical decision making, as well as reviewing the written report by the radiologist.  DG Lumbar Spine 2-3 Views  Result Date: 06/19/2020 CLINICAL DATA:  Low back pain radiating down the right leg for the past week after lifting a heavy object. EXAM: LUMBAR SPINE - 2-3 VIEW COMPARISON:  Lumbar spine x-rays dated October 10, 2018. FINDINGS: Five lumbar type vertebral bodies. Prior L4-S1 posterior fusion. No acute fracture or subluxation. Vertebral body heights are preserved. Unchanged trace anterolisthesis and mild to moderate disc height loss at L3-L4. Unchanged endplate spurring from O9-B3 through L3-L4. IMPRESSION: 1. No acute osseous abnormality. 2. 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 Electronically Signed   By: Titus Dubin M.D.   On: 06/19/2020 12:43    ____________________________________________    PROCEDURES  Procedure(s)  performed:    Procedures    Medications  oxyCODONE-acetaminophen (PERCOCET/ROXICET) 5-325 MG per tablet 1 tablet (1 tablet Oral Given 06/19/20 1206)     ____________________________________________   INITIAL IMPRESSION / ASSESSMENT AND PLAN / ED COURSE  Pertinent labs & imaging results that were available during my care of the patient were reviewed by me and considered in my medical decision making (see chart for details).  Review of the [place] CSRS was performed in accordance of the [place] prior to dispensing any controlled drugs.    Patient's diagnosis is consistent with lumbar radiculopathy.  Vital signs and exam are reassuring.  X-ray negative for acute bony abnormalities.  Pain significantly improved following Percocet.  Patient will be discharged home with prescriptions for prednisone and a very short course of Percocet.  He denies any history of diabetes.  He will continue taking his prescribed muscle relaxer.  Patient is to follow up with primary  care as directed.  He has an appoint with primary care on Thursday.  Patient is given ED precautions to return to the ED for any worsening or new symptoms.   [name] was evaluated in Emergency Department on 06/19/2020 for the symptoms described in the history of present illness. He was evaluated in the context of the global COVID-19 pandemic, which necessitated consideration that the patient might be at risk for infection with the SARS-CoV-2 virus that causes COVID-19. Institutional protocols and algorithms that pertain to the evaluation of patients at risk for COVID-19 are in a state of rapid change based on information released by regulatory bodies including the CDC and federal and state organizations. These policies and algorithms were followed during the patient's care in the ED.   ____________________________________________  FINAL CLINICAL IMPRESSION(S) / ED DIAGNOSES  Final diagnoses:  Lumbar radiculopathy      NEW MEDICATIONS STARTED DURING THIS VISIT:  ED Discharge Orders         Ordered    oxyCODONE-acetaminophen (PERCOCET) 5-325 MG tablet  Every 4 hours PRN        06/19/20 1348    predniSONE (DELTASONE) 50 MG tablet        06/19/20 1348    lidocaine (LIDODERM) 5 %  Every 12 hours        06/19/20 1348              This chart was dictated using voice recognition software/Dragon. Despite best efforts to proofread, errors can occur which can change the meaning. Any change was purely unintentional.    Laban Emperor, PA-C 06/19/20 1719    Vladimir Crofts, MD 06/20/20 [phone]

## 2020-06-19 NOTE — ED Notes (Signed)
Says was lifting a pitcher over the couch and his lower back has been hurting since (last Monday)  Says it hurts where he had the fusion. Radiates down right hip and down thigh.

## 2020-06-19 NOTE — ED Notes (Signed)
Pt calm , collective upon discharge  

## 2020-11-16 ENCOUNTER — Other Ambulatory Visit: Payer: Self-pay | Admitting: Family Medicine

## 2020-11-16 ENCOUNTER — Other Ambulatory Visit (HOSPITAL_COMMUNITY): Payer: Self-pay | Admitting: Family Medicine

## 2020-11-16 DIAGNOSIS — M5440 Lumbago with sciatica, unspecified side: Secondary | ICD-10-CM

## 2020-11-28 ENCOUNTER — Ambulatory Visit (HOSPITAL_COMMUNITY): Payer: No Typology Code available for payment source

## 2020-12-04 ENCOUNTER — Ambulatory Visit (HOSPITAL_COMMUNITY)
Admission: RE | Admit: 2020-12-04 | Discharge: 2020-12-04 | Disposition: A | Discharge status: Home / Self Care | Payer: No Typology Code available for payment source | Source: Ambulatory Visit | Attending: Family Medicine | Admitting: Family Medicine

## 2020-12-04 ENCOUNTER — Other Ambulatory Visit: Payer: Self-pay

## 2020-12-04 ENCOUNTER — Encounter (HOSPITAL_COMMUNITY): Payer: Self-pay | Admitting: *Deleted

## 2020-12-04 DIAGNOSIS — M5440 Lumbago with sciatica, unspecified side: Secondary | ICD-10-CM | POA: Diagnosis present

## 2022-02-01 ENCOUNTER — Other Ambulatory Visit: Payer: Self-pay

## 2022-02-01 ENCOUNTER — Inpatient Hospital Stay (HOSPITAL_COMMUNITY)
Admission: EM | Admit: 2022-02-01 | Discharge: 2022-02-05 | DRG: 392 | Disposition: A | Discharge status: Home / Self Care | Payer: No Typology Code available for payment source | Attending: Cardiology | Admitting: Cardiology

## 2022-02-01 ENCOUNTER — Emergency Department (HOSPITAL_COMMUNITY): Payer: No Typology Code available for payment source

## 2022-02-01 ENCOUNTER — Encounter (HOSPITAL_COMMUNITY): Payer: Self-pay | Admitting: Emergency Medicine

## 2022-02-01 DIAGNOSIS — I420 Dilated cardiomyopathy: Secondary | ICD-10-CM

## 2022-02-01 DIAGNOSIS — F32A Depression, unspecified: Secondary | ICD-10-CM | POA: Diagnosis present

## 2022-02-01 DIAGNOSIS — I110 Hypertensive heart disease with heart failure: Secondary | ICD-10-CM | POA: Diagnosis present

## 2022-02-01 DIAGNOSIS — I10 Essential (primary) hypertension: Secondary | ICD-10-CM

## 2022-02-01 DIAGNOSIS — E785 Hyperlipidemia, unspecified: Secondary | ICD-10-CM

## 2022-02-01 DIAGNOSIS — K219 Gastro-esophageal reflux disease without esophagitis: Secondary | ICD-10-CM | POA: Diagnosis not present

## 2022-02-01 DIAGNOSIS — I2510 Atherosclerotic heart disease of native coronary artery without angina pectoris: Secondary | ICD-10-CM

## 2022-02-01 DIAGNOSIS — Z7982 Long term (current) use of aspirin: Secondary | ICD-10-CM

## 2022-02-01 DIAGNOSIS — R072 Precordial pain: Secondary | ICD-10-CM | POA: Diagnosis present

## 2022-02-01 DIAGNOSIS — I371 Nonrheumatic pulmonary valve insufficiency: Secondary | ICD-10-CM | POA: Diagnosis present

## 2022-02-01 DIAGNOSIS — Z880 Allergy status to penicillin: Secondary | ICD-10-CM

## 2022-02-01 DIAGNOSIS — C9590 Leukemia, unspecified not having achieved remission: Secondary | ICD-10-CM | POA: Diagnosis present

## 2022-02-01 DIAGNOSIS — I428 Other cardiomyopathies: Secondary | ICD-10-CM

## 2022-02-01 DIAGNOSIS — E041 Nontoxic single thyroid nodule: Secondary | ICD-10-CM

## 2022-02-01 DIAGNOSIS — Z87891 Personal history of nicotine dependence: Secondary | ICD-10-CM

## 2022-02-01 DIAGNOSIS — I5022 Chronic systolic (congestive) heart failure: Secondary | ICD-10-CM | POA: Diagnosis present

## 2022-02-01 DIAGNOSIS — Z7952 Long term (current) use of systemic steroids: Secondary | ICD-10-CM

## 2022-02-01 HISTORY — DX: Other cardiomyopathies: I42.8

## 2022-02-01 HISTORY — DX: Chronic systolic (congestive) heart failure: I50.22

## 2022-02-01 HISTORY — DX: Nontoxic single thyroid nodule: E04.1

## 2022-02-01 HISTORY — DX: Mixed hyperlipidemia: E78.2

## 2022-02-01 LAB — CBC
HCT: 43.4 % (ref 39.0–52.0)
Hemoglobin: 14.3 g/dL (ref 13.0–17.0)
MCH: 30 pg (ref 26.0–34.0)
MCHC: 32.9 g/dL (ref 30.0–36.0)
MCV: 91 fL (ref 80.0–100.0)
Platelets: 211 10*3/uL (ref 150–400)
RBC: 4.77 MIL/uL (ref 4.22–5.81)
RDW: 12.4 % (ref 11.5–15.5)
WBC: 18.7 10*3/uL — ABNORMAL HIGH (ref 4.0–10.5)
nRBC: 0 % (ref 0.0–0.2)

## 2022-02-01 LAB — BASIC METABOLIC PANEL
Anion gap: 9 (ref 5–15)
BUN: 7 mg/dL — ABNORMAL LOW (ref 8–23)
CO2: 25 mmol/L (ref 22–32)
Calcium: 9.1 mg/dL (ref 8.9–10.3)
Chloride: 105 mmol/L (ref 98–111)
Creatinine, Ser: 1.2 mg/dL (ref 0.61–1.24)
GFR, Estimated: >60 mL/min (ref >60)
Glucose, Bld: 126 mg/dL — ABNORMAL HIGH (ref 70–99)
Potassium: 3.5 mmol/L (ref 3.5–5.1)
Sodium: 139 mmol/L (ref 135–145)

## 2022-02-01 LAB — URINALYSIS, ROUTINE W REFLEX MICROSCOPIC
Bilirubin Urine: NEGATIVE
Glucose, UA: NEGATIVE mg/dL
Hgb urine dipstick: NEGATIVE
Ketones, ur: NEGATIVE mg/dL
Leukocytes,Ua: NEGATIVE
Nitrite: NEGATIVE
Protein, ur: NEGATIVE mg/dL
Specific Gravity, Urine: 1.016 (ref 1.005–1.030)
pH: 6 (ref 5.0–8.0)

## 2022-02-01 LAB — TROPONIN I (HIGH SENSITIVITY)
Troponin I (High Sensitivity): 12 ng/L (ref <18)
Troponin I (High Sensitivity): 11 ng/L (ref <18)

## 2022-02-01 LAB — BRAIN NATRIURETIC PEPTIDE: B Natriuretic Peptide: 13.5 pg/mL (ref 0.0–100.0)

## 2022-02-01 MED ORDER — MELATONIN 5 MG PO TABS
5.0000 mg | ORAL_TABLET | Freq: Every evening | ORAL | Status: DC | PRN
  Administered 2022-02-02 – 2022-02-04 (×4): 5 mg via ORAL
  Filled 2022-02-01 (×4): qty 1

## 2022-02-01 MED ORDER — HEPARIN SODIUM (PORCINE) 5000 UNIT/ML IJ SOLN
5000.0000 [IU] | Freq: Three times a day (TID) | INTRAMUSCULAR | Status: DC
  Administered 2022-02-02 – 2022-02-05 (×8): 5000 [IU] via SUBCUTANEOUS
  Filled 2022-02-01 (×9): qty 1

## 2022-02-01 MED ORDER — DULOXETINE HCL 60 MG PO CPEP
60.0000 mg | ORAL_CAPSULE | Freq: Every day | ORAL | Status: DC
  Administered 2022-02-02 – 2022-02-05 (×4): 60 mg via ORAL
  Filled 2022-02-01 (×4): qty 1

## 2022-02-01 MED ORDER — ATORVASTATIN CALCIUM 80 MG PO TABS
80.0000 mg | ORAL_TABLET | Freq: Every day | ORAL | Status: DC
  Administered 2022-02-02 – 2022-02-05 (×4): 80 mg via ORAL
  Filled 2022-02-01 (×4): qty 1

## 2022-02-01 MED ORDER — SPIRONOLACTONE 25 MG PO TABS
25.0000 mg | ORAL_TABLET | Freq: Every day | ORAL | Status: DC
Start: 2022-02-02 — End: 2022-02-05
  Administered 2022-02-02 – 2022-02-05 (×3): 25 mg via ORAL
  Filled 2022-02-01 (×3): qty 1

## 2022-02-01 MED ORDER — CARVEDILOL 3.125 MG PO TABS
3.1250 mg | ORAL_TABLET | Freq: Two times a day (BID) | ORAL | Status: DC
Start: 2022-02-01 — End: 2022-02-05
  Administered 2022-02-02 – 2022-02-04 (×7): 3.125 mg via ORAL
  Filled 2022-02-01 (×6): qty 1

## 2022-02-01 MED ORDER — NITROGLYCERIN 0.4 MG SL SUBL: 0.4000 mg | SUBLINGUAL_TABLET | SUBLINGUAL | Status: DC | PRN

## 2022-02-01 MED ORDER — ONDANSETRON HCL 4 MG/2ML IJ SOLN: 4.0000 mg | Freq: Four times a day (QID) | INTRAMUSCULAR | Status: DC | PRN

## 2022-02-01 MED ORDER — LISINOPRIL 20 MG PO TABS
20.0000 mg | ORAL_TABLET | Freq: Every day | ORAL | Status: DC
  Administered 2022-02-02 – 2022-02-03 (×2): 20 mg via ORAL
  Filled 2022-02-01 (×2): qty 1

## 2022-02-01 MED ORDER — ASPIRIN 81 MG PO TBEC
81.0000 mg | DELAYED_RELEASE_TABLET | Freq: Every day | ORAL | Status: DC
  Administered 2022-02-02 – 2022-02-05 (×3): 81 mg via ORAL
  Filled 2022-02-01 (×4): qty 1

## 2022-02-01 MED ORDER — ACETAMINOPHEN 325 MG PO TABS: 650.0000 mg | ORAL_TABLET | ORAL | Status: DC | PRN

## 2022-02-01 MED ORDER — ASPIRIN 81 MG PO CHEW
324.0000 mg | CHEWABLE_TABLET | Freq: Once | ORAL | Status: AC
  Administered 2022-02-01: 324 mg via ORAL
  Filled 2022-02-01: qty 4

## 2022-02-01 NOTE — ED Triage Notes (Signed)
Pt reported to ED stating he is having left sided chest pain that does not radiate and shortness of breath. Symptoms are accompanied by nausea and he  feels that it my be related to CHF. States he has had near-syncopal episodes and swelling to legs intermittently.

## 2022-02-01 NOTE — ED Notes (Signed)
Pt in room A&O x4. Came in for CP this AM that has since be relived. Pt denies SOB. No N/V/D.

## 2022-02-01 NOTE — ED Provider Notes (Signed)
[name]   CSN: [id number] Arrival date & time: 02/01/22  4098     History  Chief Complaint  Patient presents with   Chest Pain    [name] is a 66 y.o. male.  The history is provided by the patient and medical records. No language interpreter was used.  Chest Pain   66 year old male significant history of leukemia, hypertension, CHF, depression, who presents with complaints of chest pain.  Patient reported last night he experienced a sensation of chest pressure as well is having some shortness of breath.  Chest pressure is gradual onset and has mostly resolved.  He does not radiate to his jaw or his arms.  He also felt a bit nauseous, and lightheadedness during this episode.  He recalls walking out to his car and felt very weak and tired.  He felt like this is related to his CHF.  He also noticed increased fluid retention in his lower extremities for the past few days.  He does not endorse any fever, worsening cough, headache, abdominal pain, vomiting or diarrhea.  He did notice some urinary discomfort and thought he may have had a UTI.  He reports his last cardiac stress test was about 6 years ago and it was normal.  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  Home Medications Prior to Admission medications   Medication Sig Start Date End Date Taking? Authorizing Provider  acetaminophen (TYLENOL) 500 MG tablet Take 1,000 mg by mouth every 4 (four) hours as needed for mild pain.    [provider]  aspirin EC 81 MG tablet Take 81 mg by mouth daily.    [provider]  atorvastatin (LIPITOR) 80 MG tablet Take 80 mg by mouth daily.    [provider]  CARVEDILOL PO Take 1 tablet by mouth 2 (two) times daily.    [provider]  diclofenac Sodium (VOLTAREN) 1 % GEL Apply topically 4 (four) times daily.    [provider]  diphenhydrAMINE (BENADRYL)  25 MG tablet Take 25 mg by mouth every 6 (six) hours as needed for allergies.    [provider]  DULoxetine (CYMBALTA) 60 MG capsule Take 60 mg by mouth daily.    [provider]  gabapentin (NEURONTIN) 600 MG tablet Take 600 mg by mouth 3 (three) times daily.    [provider]  hydrochlorothiazide (HYDRODIURIL) 25 MG tablet Take 25 mg by mouth daily.    [provider]  hydrOXYzine (VISTARIL) 50 MG capsule Take 50 mg by mouth 3 (three) times daily as needed.    [provider]  lisinopril (ZESTRIL) 20 MG tablet Take 20 mg by mouth daily.    [provider]  Melatonin 5 MG CAPS Take 5 mg by mouth at bedtime as needed.    [provider]  methacholine (PROVOCHOLINE) 100 MG inhaler solution Inhale into the lungs once.    [provider]  methocarbamol (ROBAXIN) 750 MG tablet Take 750 mg by mouth every 8 (eight) hours as needed for muscle spasms.    [provider]  predniSONE (DELTASONE) 50 MG tablet Take 1 tablet per day 06/19/20   Laban Emperor, PA-C  SPIRONOLACTONE PO Take 1 tablet by mouth daily.    [provider]      Allergies    Penicillins    Review of Systems   Review of Systems  Cardiovascular:  Positive for chest pain.  All other systems reviewed and are negative.   Physical Exam Updated Vital Signs BP 112/65   Pulse 60   Temp 98.5 F (36.9 C) (Oral)   Resp 18   SpO2 100%  Physical Exam Vitals and nursing [name] reviewed.  Constitutional:      General: He is not in acute distress.    Appearance: He is well-developed.  HENT:     Head: Atraumatic.  Eyes:     Conjunctiva/sclera: Conjunctivae normal.  Cardiovascular:     Rate and Rhythm: Normal rate and regular rhythm.     Pulses: Normal pulses.     Heart sounds: Normal heart sounds.  Pulmonary:     Effort: Pulmonary effort is normal.     Breath sounds: Normal breath sounds. No wheezing, rhonchi or rales.  Abdominal:      Palpations: Abdomen is soft.     Tenderness: There is no abdominal tenderness.  Musculoskeletal:     Cervical back: Normal range of motion and neck supple.     Right lower leg: No edema.     Left lower leg: No edema.  Skin:    Findings: No rash.  Neurological:     Mental Status: He is alert. Mental status is at baseline.     ED Results / Procedures / Treatments   Labs (all labs ordered are listed, but only abnormal results are displayed) Labs Reviewed  BASIC METABOLIC PANEL - Abnormal; Notable for the following components:      Result Value   Glucose, Bld 126 (*)    BUN 7 (*)    All other components within normal limits  CBC - Abnormal; Notable for the following components:   WBC 18.7 (*)    All other components within normal limits  URINALYSIS, ROUTINE W REFLEX MICROSCOPIC  BRAIN NATRIURETIC PEPTIDE  TROPONIN I (HIGH SENSITIVITY)  TROPONIN I (HIGH SENSITIVITY)    EKG EKG Interpretation  Date/Time:  Friday February 01 2022 06:46:00 EDT Ventricular Rate:  81 PR Interval:  162 QRS Duration: 102 QT Interval:  358 QTC Calculation: 415 R Axis:   -32 Text Interpretation: Normal sinus rhythm Left axis deviation Minimal voltage criteria for LVH, may be normal variant ( Cornell product ) Abnormal ECG When compared with ECG of 03-Feb-2009 15:40, no acute changes Confirmed by Madalyn Rob [phone]) on 02/01/2022 3:54:33 PM  Radiology DG Chest 2 View  Result Date: 02/01/2022 CLINICAL DATA:  Chest pain EXAM: CHEST - 2 VIEW COMPARISON:  12/10/2018 FINDINGS: Upper mediastinal widening with tracheal deviation to the left, there was a right-sided thyroid nodule on chest CT in 2010. no visible narrowing of the tracheal air column. There is no edema, consolidation, effusion, or pneumothorax. Normal heart size and aortic contours IMPRESSION: 1. No evidence of acute cardiopulmonary disease. 2. 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  Electronically Signed   By: Jorje Guild M.D.   On: 02/01/2022 07:40    Procedures Procedures    Medications Ordered in ED Medications  aspirin chewable tablet 324 mg (324 mg Oral Given 02/01/22 1709)    ED Course/ Medical Decision Making/ A&P                           Medical Decision Making Risk OTC drugs. Decision regarding hospitalization.   BP 112/65   Pulse 60   Temp 98.5 F (36.9 C) (Oral)  Resp 18   SpO2 100%   4:05 PM This is a 66 year old male with a history of CHF, leukemia, hypertension and depression here with complaints of chest pain.  Endorse gradual onset of chest pressure along with shortness of breath and generalized fatigue since yesterday.  He also endorse some associated nausea and lightheadedness while he was walking to and from his car.  He was concerned that this could be related to his CHF.  He also noticed increased fluid retention to his legs for the past few days.  He states that his pain is mostly resolved.  He did endorse some increased urinary frequency and urgency for the past few days without any abdominal pain or burning on urination.  On exam patient is resting comfortably appears to be in no acute discomfort.  He does not appear to be fluid overloaded.  No evidence of JVD, lungs are clear, no significant peripheral edema noted.  No reproducible pain in his chest.  Patient is afebrile, vital signs stable, no hypoxia.  Labs imaging and EKG independently viewed interpreted by me and I agree with radiologist interpretation.  Urinalysis without any signs of UTI.  He has normal renal function, Electrolyte panels are reassuring, he does have elevated white count of 18.7 without any infectious etiology.  Leukocytosis may be secondary to his underlying leukemia.  His EKG without concerning arrhythmia or ischemic changes.  His initial troponin is normal.  His initial BNP is normal as well.  Chest x-ray shows no evidence of acute cardiopulmonary disease.   X-ray did mention leftward tracheal mass effect likely related to a right-sided thyroid nodule noted in 2010 chest CT.  Patient does not endorse any trouble swallowing or having neck pain and I felt that this is likely a chronic change.  Patient however score 4 on the heart pathway which put him at high risk for MACE.   4:41 PM Appreciate consultation from cardiology team who agrees to see patient in the ED and will determine disposition.  8:40 PM Cardiology team has seen and evaluated patient and will admit patient for further work-up.  This patient presents to the ED for concern of chest pain, this involves an extensive number of treatment options, and is a complaint that carries with it a high risk of complications and morbidity.  The differential diagnosis includes ACS, PE, costochondritis, GERD, gastritis, PTX  Co morbidities that complicate the patient evaluation HTN  CHF Additional history obtained:  Additional history obtained from EMS External records from outside source obtained and reviewed including EMR including prior labs and imaging  Lab Tests:  I Ordered, and personally interpreted labs.  The pertinent results include:  as above  Imaging Studies ordered:  I ordered imaging studies including CXR I independently visualized and interpreted imaging which showed no acute finding I agree with the radiologist interpretation  Cardiac Monitoring:  The patient was maintained on a cardiac monitor.  I personally viewed and interpreted the cardiac monitored which showed an underlying rhythm of: NSR  Medicines ordered and prescription drug management:  I ordered medication including asa  for cp Reevaluation of the patient after these medicines showed that the patient improved I have reviewed the patients home medicines and have made adjustments as needed  Test Considered: as above  Critical Interventions: as above  Consultations Obtained:  I requested consultation  with the cardiology,  and discussed lab and imaging findings as well as pertinent plan - they recommend: admission  Problem List / ED Course:  chest pain  Reevaluation:  After the interventions noted above, I reevaluated the patient and found that they have :improved  Social Determinants of Health: tobacco use  Dispostion:  After consideration of the diagnostic results and the patients response to treatment, I feel that the patent would benefit from admission.         Final Clinical Impression(s) / ED Diagnoses Final diagnoses:  Precordial chest pain    Rx / DC Orders ED Discharge Orders     None         Domenic Moras, PA-C 02/01/22 2042    Lucrezia Starch, MD 02/01/22 2123

## 2022-02-01 NOTE — H&P (Addendum)
History & Physical    Patient ID: [name] MRN: [id number], DOB/AGE: [birth year]   Admit date: 02/01/2022   Primary Physician: Default, Provider, MD Primary Cardiologist: [name] Hew, MD  Patient Profile    66 y/o ? w/ a h/o NICM/HFrEF, HTN, HL, depression, leukemia, and thyroid nodule, who presented to the ED 7/21 w/ chest pain and dyspnea.  Past Medical History    Past Medical History:  Diagnosis Date   Chronic HFrEF (heart failure with reduced ejection fraction) ([employer])    a. 09/2008 Echo: EF 30-40%, mod diff HK. Mildly dil LA.   Depression    Hypertension    Leukemia ([place])    NICM (nonischemic cardiomyopathy) ([place])    A. 09/2008 Echo: EF 30-40%; b. 09/2008 Cath: Nl cors, EF 40%; c. ~ 2018 reportedly nl stress test.   Thyroid nodule     Past Surgical History:  Procedure Laterality Date   BACK SURGERY       Allergies  Allergies  Allergen Reactions   Penicillins Palpitations    History of Present Illness    66 y/o ? w/ a h/o NICM, HFrEF, HTN, HL, depression, leukemia, and thyroid nodule.  In 09/2008, he was admitted to [facility] w/ chest pain and dyspnea.  Cath showed nl cors and an EF of 40%.  Echo showed an EF of 30-40% w/ moderate diffuse HK.  He was medically managed and subsequently f/u @ the [facility] but notes, that he has not seen cardiology in several years.  About 5 years ago, he was admitted with chest pain to a hospital in [facility].  He underwent stress testing, and believes that it was normal.  [name] lives locally and is reasonably active without symptoms or limitations.  He is going to the [facility] about 3 times a week doing water aerobics and light weight lifting and denies chest pain or dyspnea.  He was in his usual state of health until the early evening of July 20, when he went out to his car and remembered that he forgot something.  As he turned and walked back to his apartment, he began experiencing profound dyspnea and fatigue.  He  went and sat in his apartment for a little while and around 7 PM, he started noticing substernal chest discomfort that got up to 7/10.  This was associated with dyspnea, intermittent nausea, and mild lightheadedness.  Symptoms persisted throughout the night and he was very restless, sleeping very little.  Due to persistent symptoms, he presented to the emergency department here.  ECG showed sinus rhythm at 81 without acute ST or T changes.  Despite prolonged symptoms, troponin has been normal at 12  11.  Symptoms improved without intervention, though he continues to report 4/10 chest discomfort.  He is currently hemodynamically stable.  Home Medications    Prior to Admission medications   Medication Sig Start Date End Date Taking? Authorizing [name]  acetaminophen (TYLENOL) 500 MG tablet Take 1,000 mg by mouth every 4 (four) hours as needed for mild pain.    [name], Historical, MD  aspirin EC 81 MG tablet Take 81 mg by mouth daily.    [name], Historical, MD  atorvastatin (LIPITOR) 80 MG tablet Take 80 mg by mouth daily.    [name], Historical, MD  CARVEDILOL PO Take 1 tablet by mouth 2 (two) times daily.    [name], Historical, MD  diclofenac Sodium (VOLTAREN) 1 % GEL Apply topically 4 (four) times daily.    [name],  Historical, MD  diphenhydrAMINE (BENADRYL) 25 MG tablet Take 25 mg by mouth every 6 (six) hours as needed for allergies.    [name], Historical, MD  DULoxetine (CYMBALTA) 60 MG capsule Take 60 mg by mouth daily.    [name], Historical, MD  gabapentin (NEURONTIN) 600 MG tablet Take 600 mg by mouth 3 (three) times daily.    [name], Historical, MD  hydrochlorothiazide (HYDRODIURIL) 25 MG tablet Take 25 mg by mouth daily.    [name], Historical, MD  hydrOXYzine (VISTARIL) 50 MG capsule Take 50 mg by mouth 3 (three) times daily as needed.    [name], Historical, MD  lisinopril (ZESTRIL) 20 MG tablet Take 20 mg by mouth daily.    [name], Historical, MD  Melatonin 5 MG CAPS  Take 5 mg by mouth at bedtime as needed.    [name], Historical, MD  methacholine (PROVOCHOLINE) 100 MG inhaler solution Inhale into the lungs once.    [name], Historical, MD  methocarbamol (ROBAXIN) 750 MG tablet Take 750 mg by mouth every 8 (eight) hours as needed for muscle spasms.    [name], Historical, MD  predniSONE (DELTASONE) 50 MG tablet Take 1 tablet per day 06/19/20   Laban Emperor, PA-C  SPIRONOLACTONE PO Take 1 tablet by mouth daily.    [name], Historical, MD    Family History    History reviewed. No pertinent family history. He indicated that his mother is deceased. He indicated that his father is deceased.   Social History    Social History   Socioeconomic History   Marital status: Married    Spouse name: Not on file   Number of children: Not on file   Years of education: Not on file   Highest education level: Not on file  Occupational History   Not on file  Tobacco Use   Smoking status: Former   Smokeless tobacco: Never   Tobacco comments:    Quit 40 yrs ago  Substance and Sexual Activity   Alcohol use: Not Currently    Comment: none x 40 yrs   Drug use: Not Currently    Comment: none x 40 yrs   Sexual activity: Not on file  Other Topics Concern   Not on file  Social History Narrative   Lives locally.  Exercises ~ 3x/wk.   Social Determinants of Health   Financial Resource Strain: Not on file  Food Insecurity: Not on file  Transportation Needs: Not on file  Physical Activity: Not on file  Stress: Not on file  Social Connections: Not on file  Intimate Partner Violence: Not on file     Review of Systems    General:  No chills, fever, night sweats or weight changes.  Cardiovascular:  +++ chest pain, +++ dyspnea on exertion, +++ lightheadedness associated with chest pain and dyspnea, no edema, orthopnea, palpitations, paroxysmal nocturnal dyspnea. Dermatological: No rash, lesions/masses Respiratory: No cough, +++ dyspnea Urologic: No  hematuria, dysuria Abdominal:   +++ Mild intermittent nausea prior to presentation, no vomiting, diarrhea, bright red blood per rectum, melena, or hematemesis Neurologic:  No visual changes, wkns, changes in mental status. All other systems reviewed and are otherwise negative except as noted above.  Physical Exam    Blood pressure 123/74, pulse (!) 53, temperature 98.5 F (36.9 C), temperature source Oral, resp. rate 16, SpO2 98 %.  General: Pleasant, NAD Psych: Normal affect. Neuro: Alert and oriented X 3. Moves all extremities spontaneously. HEENT: Normal  Neck: Supple without bruits or JVD. Lungs:  Resp  regular and unlabored, CTA. Heart: Brady, RR, Normal S1 & S2; no s3, s4, or murmurs. Abdomen: Soft, non-tender, non-distended, BS + x 4.  Extremities: No clubbing, cyanosis or edema. DP/PT 2+, Radials 2+ and equal bilaterally.  Labs    Cardiac Enzymes Recent Labs  Lab 02/01/22 0716  TROPONINIHS 12      Lab Results  Component Value Date   WBC 18.7 (H) 02/01/2022   HGB 14.3 02/01/2022   HCT 43.4 02/01/2022   MCV 91.0 02/01/2022   PLT 211 02/01/2022    Recent Labs  Lab 02/01/22 0716  NA 139  K 3.5  CL 105  CO2 25  BUN 7*  CREATININE 1.20  CALCIUM 9.1  GLUCOSE 126*   BNP    Component Value Date/Time   BNP 13.5 02/01/2022 0716    ProBNP    Component Value Date/Time   PROBNP 61.0 02/04/2009 0953     Radiology Studies    DG Chest 2 View  Result Date: 02/01/2022 CLINICAL DATA:  Chest pain EXAM: CHEST - 2 VIEW COMPARISON:  12/10/2018 FINDINGS: Upper mediastinal widening with tracheal deviation to the left, there was a right-sided thyroid nodule on chest CT in 2010. no visible narrowing of the tracheal air column. There is no edema, consolidation, effusion, or pneumothorax. Normal heart size and aortic contours IMPRESSION: 1. No evidence of acute cardiopulmonary disease. 2. Leftward tracheal mass effect likely related to a right-sided thyroid nodule noted by  2010 chest CT. Electronically Signed   By: Jorje Guild M.D.   On: 02/01/2022 07:40    ECG & Cardiac Imaging    Regular sinus rhythm, 81, left axis deviation, block, left atrial enlargement LVH- personally reviewed.  Assessment & Plan    1.  Precordial chest pain: Patient presented with an 11-hour history of substernal chest discomfort associated with dyspnea and lightheadedness.  Presenting ECG without acute ST or T changes.  Troponins have been normal at 12  11.  He continues to report 4/10 chest discomfort with some pleuritic component.  He is hemodynamically stable.  He does have a known prior history of nonischemic cardiomyopathy with normal coronary arteries on catheterization in 2010.   We will arrange for coronary CT angiogram in the a.m.   Continue aspirin, statin, beta-blocker, spironolactone, and ACE inhibitor therapy.  2.  Nonischemic cardiomyopathy: Diagnosed in 2010 with an EF of 30 to 40% by echo at that time.  He was briefly followed by cardiology at the [facility] but says he has not been seen by cardiology in several years.  He notes good activity tolerance at home and is euvolemic on examination today.   Continue beta-blocker and ACE inhibitor therapy.   Follow-up echocardiogram.  3.  Essential hypertension: Blood pressure stable.  Continue home regimen.  4.  Leukemia: White count 18.7.  Followed at the [place].  5.  Thyroid nodule: Chest x-ray suggestive leftward tracheal mass effect likely related to right-sided thyroid nodule which was previously noted on CT in 2010.  Follow-up thyroid function testing.  Risk Assessment/Risk Scores:     Signed, Murray Hodgkins, NP 02/01/2022, 6:07 PM    ATTENDING ATTESTATION  I have seen, examined and evaluated the patient this afternoon along with Murray Hodgkins, MD.  After reviewing all the available data and chart, we discussed the patients laboratory, study & physical findings as well as symptoms in detail. I agree with his  findings, examination as well as impression recommendations as per our discussion.    Attending adjustments  noted in italics.  Atypical sounding chest comfort, however with his history hypertension and nonischemic cardiomyopathy in the past with no clear ischemia evaluate.  Years ago, reasonable to proceed with definitive diagnostic noninvasive testing with Coronary CTA.  Also need to repeat 2D echo and see what his new baseline is.  We will follow-up to see what the results of the tests are.    [name], M.D., M.S. Interventional Cardiologist   Pager # [phone] Phone # [phone] [address] [place], [place] [address]

## 2022-02-01 NOTE — ED Provider Triage Note (Signed)
Emergency Medicine Provider Triage Evaluation Note  [name] , a 66 y.o. male  was evaluated in triage.  Pt complains that yesterday he had feeling like urinary tract infection with increased urinary frequency and dysuria, later in the day he began experiencing some shortness of breath especially with exertion as well as chest pain.  Patient reports the chest pain lasted all night, and into this morning.  Seems worse with exertion.  He denies nausea, vomiting, diaphoresis.  Reports history of leukemia not currently undergoing treatment, as well as congestive heart failure.  He reports that he has had some slightly increased swelling to his legs.  Review of Systems  Positive: Shortness of breath, chest pain, dysuria Negative: Fever, chills, nausea, vomiting  Physical Exam  BP 133/81 (BP Location: Left Arm)   Pulse 87   Temp 99.1 F (37.3 C) (Oral)   Resp 18   SpO2 100%  Gen:   Awake, no distress   Resp:  Normal effort  MSK:   Moves extremities without difficulty  Other:  1+ edema BIL LE  Medical Decision Making  Medically screening exam initiated at 8:33 AM.  Appropriate orders placed.  [name] was informed that the remainder of the evaluation will be completed by another provider, this initial triage assessment does not replace that evaluation, and the importance of remaining in the ED until their evaluation is complete.  Workup initiated   Anselmo Pickler, [place] 02/01/22 [phone]

## 2022-02-02 ENCOUNTER — Observation Stay (HOSPITAL_COMMUNITY): Payer: No Typology Code available for payment source

## 2022-02-02 DIAGNOSIS — Z87891 Personal history of nicotine dependence: Secondary | ICD-10-CM | POA: Diagnosis not present

## 2022-02-02 DIAGNOSIS — K219 Gastro-esophageal reflux disease without esophagitis: Secondary | ICD-10-CM | POA: Diagnosis present

## 2022-02-02 DIAGNOSIS — I428 Other cardiomyopathies: Secondary | ICD-10-CM | POA: Diagnosis not present

## 2022-02-02 DIAGNOSIS — I371 Nonrheumatic pulmonary valve insufficiency: Secondary | ICD-10-CM | POA: Diagnosis present

## 2022-02-02 DIAGNOSIS — Z880 Allergy status to penicillin: Secondary | ICD-10-CM | POA: Diagnosis not present

## 2022-02-02 DIAGNOSIS — I2510 Atherosclerotic heart disease of native coronary artery without angina pectoris: Secondary | ICD-10-CM

## 2022-02-02 DIAGNOSIS — I5022 Chronic systolic (congestive) heart failure: Secondary | ICD-10-CM | POA: Diagnosis present

## 2022-02-02 DIAGNOSIS — E041 Nontoxic single thyroid nodule: Secondary | ICD-10-CM | POA: Diagnosis present

## 2022-02-02 DIAGNOSIS — I10 Essential (primary) hypertension: Secondary | ICD-10-CM

## 2022-02-02 DIAGNOSIS — Z7982 Long term (current) use of aspirin: Secondary | ICD-10-CM | POA: Diagnosis not present

## 2022-02-02 DIAGNOSIS — Z7952 Long term (current) use of systemic steroids: Secondary | ICD-10-CM | POA: Diagnosis not present

## 2022-02-02 DIAGNOSIS — E785 Hyperlipidemia, unspecified: Secondary | ICD-10-CM | POA: Diagnosis present

## 2022-02-02 DIAGNOSIS — R072 Precordial pain: Secondary | ICD-10-CM | POA: Diagnosis present

## 2022-02-02 DIAGNOSIS — C9590 Leukemia, unspecified not having achieved remission: Secondary | ICD-10-CM | POA: Diagnosis present

## 2022-02-02 DIAGNOSIS — I110 Hypertensive heart disease with heart failure: Secondary | ICD-10-CM | POA: Diagnosis present

## 2022-02-02 DIAGNOSIS — F32A Depression, unspecified: Secondary | ICD-10-CM | POA: Diagnosis present

## 2022-02-02 LAB — CBC
HCT: 35.4 % — ABNORMAL LOW (ref 39.0–52.0)
Hemoglobin: 11.7 g/dL — ABNORMAL LOW (ref 13.0–17.0)
MCH: 30.6 pg (ref 26.0–34.0)
MCHC: 33.1 g/dL (ref 30.0–36.0)
MCV: 92.7 fL (ref 80.0–100.0)
Platelets: 145 10*3/uL — ABNORMAL LOW (ref 150–400)
RBC: 3.82 MIL/uL — ABNORMAL LOW (ref 4.22–5.81)
RDW: 12.7 % (ref 11.5–15.5)
WBC: 11.8 10*3/uL — ABNORMAL HIGH (ref 4.0–10.5)
nRBC: 0 % (ref 0.0–0.2)

## 2022-02-02 LAB — T4, FREE: Free T4: 0.92 ng/dL (ref 0.61–1.12)

## 2022-02-02 LAB — ECHOCARDIOGRAM COMPLETE
Area-P 1/2: 3.19 cm2
Calc EF: 51.8 %
Height: 69 in
S' Lateral: 3.5 cm
Single Plane A2C EF: 49.3 %
Single Plane A4C EF: 52.5 %
Weight: 3827.2 oz

## 2022-02-02 LAB — LIPID PANEL
Cholesterol: 77 mg/dL (ref 0–200)
HDL: 28 mg/dL — ABNORMAL LOW (ref >40)
LDL Cholesterol: 41 mg/dL (ref 0–99)
Total CHOL/HDL Ratio: 2.8 RATIO
Triglycerides: 41 mg/dL (ref <150)
VLDL: 8 mg/dL (ref 0–40)

## 2022-02-02 LAB — TSH: TSH: 0.65 u[IU]/mL (ref 0.350–4.500)

## 2022-02-02 LAB — CREATININE, SERUM
Creatinine, Ser: 1.02 mg/dL (ref 0.61–1.24)
GFR, Estimated: >60 mL/min (ref >60)

## 2022-02-02 MED ORDER — NITROGLYCERIN 0.4 MG SL SUBL
SUBLINGUAL_TABLET | SUBLINGUAL | Status: AC
  Filled 2022-02-02: qty 2

## 2022-02-02 MED ORDER — TRAMADOL HCL 50 MG PO TABS
50.0000 mg | ORAL_TABLET | Freq: Four times a day (QID) | ORAL | Status: DC | PRN
  Administered 2022-02-02 – 2022-02-05 (×8): 50 mg via ORAL
  Filled 2022-02-02 (×8): qty 1

## 2022-02-02 MED ORDER — IOHEXOL 350 MG/ML SOLN
100.0000 mL | Freq: Once | INTRAVENOUS | Status: AC | PRN
  Administered 2022-02-02: 100 mL via INTRAVENOUS

## 2022-02-02 MED ORDER — LIDOCAINE 5 % EX PTCH
1.0000 | MEDICATED_PATCH | CUTANEOUS | Status: DC
  Administered 2022-02-02 – 2022-02-05 (×4): 1 via TRANSDERMAL
  Filled 2022-02-02 (×4): qty 1

## 2022-02-02 NOTE — Progress Notes (Signed)
  Echocardiogram 2D Echocardiogram has been performed.  Darlina Sicilian M 02/02/2022, 8:23 AM

## 2022-02-02 NOTE — Discharge Summary (Signed)
Discharge Summary    Patient ID: [name] MRN: [id number]; DOB: 1955/10/08  Admit date: 02/01/2022 Discharge date: 02/05/2022  PCP:  [name], Provider, MD   [facility] Providers Cardiologist:  Glenetta Hew, MD        Discharge Diagnoses    Principal Problem:   Precordial chest pain Active Problems:   HTN (hypertension)   Hyperlipidemia   NICM (nonischemic cardiomyopathy) ([place])   Thyroid nodule   CAD in native artery   Diagnostic Studies/Procedures    Cardiac Cath 02/04/22 1.  Patent left main with mild irregularity 2.  Patent LAD with mild diffuse proximal to mid LAD stenosis estimated at 30% 3.  Patent left circumflex with mild nonobstructive plaquing 4.  Dominant RCA with mild irregularity and no significant stenosis 5.  Normal LVEDP  Recommendations: Medical therapy for nonobstructive CAD  2D Echo 02/02/22   1. Left ventricular ejection fraction, by estimation, is 45 to 50%. The  left ventricle has mildly decreased function. The left ventricle  demonstrates global hypokinesis. Left ventricular diastolic parameters are  consistent with Grade I diastolic  dysfunction (impaired relaxation).   2. Right ventricular systolic function is normal. The right ventricular  size is normal. Tricuspid regurgitation signal is inadequate for assessing  PA pressure.   3. Left atrial size was mildly dilated.   4. The mitral valve is grossly normal. No evidence of mitral valve  regurgitation. No evidence of mitral stenosis.   5. The aortic valve is tricuspid. Aortic valve regurgitation is not  visualized. No aortic stenosis is present.   6. Pulmonic valve regurgitation is moderate.   Comparison(s): No prior Echocardiogram.   Coronary CTA 02/02/22 FINDINGS: Image quality: Good   Noise artifact is: Limited   Coronary calcium score is 339, which places the patient in the 86th percentile for age and sex matched control.   Coronary arteries: Normal coronary origins.   Right dominance.   Right Coronary Artery: Minimal mixed atherosclerotic plaque in the proximal RCA, <25% stenosis. PLA patent without significant stenosis. PDA origin visualized but remainder of vessel truncated in z-axis coverage.   Left Main Coronary Artery: Mild mixed atherosclerotic plaque in the distal left main coronary artery, 25-49% stenosis.   Left Anterior Descending Coronary Artery: Moderate mixed atherosclerotic plaque in the proximal LAD, 50-69% stenosis with positive remodeling and low attenuation plaque which are high risk features. Severe mixed atherosclerotic plaque in the mid LAD, 70-99% stenosis and primarily calcified. Moderate atherosclerotic plaque in the mid LAD, 50-69% stenosis.   Left Circumflex Artery: Minimal mixed atherosclerotic plaque in the distal LCx and proximal large caliber OM1, <25% stenosis.   Aorta: Normal size, 32 mm at the mid ascending aorta (level of the PA bifurcation) measured double oblique. No calcifications. No dissection.   Aortic Valve: No calcifications. AV calcium score 0   Other findings:   Normal pulmonary vein drainage into the left atrium.   Normal left atrial appendage without thrombus.   Normal size of the pulmonary artery.   IMPRESSION: 1. Severe CAD in the mid LAD, 70-99% stenosis, CADRADS 4.   2. Coronary calcium score is 339, which places the patient in the 86th percentile for age and sex matched control.   3. Normal coronary origins with right dominance.   CT FFR will be sent however may not be available due to technical limitations of the scan.   RECOMMENDATIONS: CAD-RADS 4 Severe stenosis. (70-99% or > 50% left main). Cardiac catheterization or CT FFR is recommended. Consider symptom-guided  anti-ischemic pharmacotherapy as well as risk factor modification per guideline directed care.   Electronically Signed: By: Cherlynn Kaiser M.D. On: 02/02/2022 14:50    FINDINGS: Vascular: Coronary calcium. No  significant extracardiac vascular findings.   Mediastinum/Nodes: No lymphadenopathy.   Lungs/Pleura: Right middle lobe calcified granulomas. No focal airspace disease. No suspicious pulmonary nodules.   Upper Abdomen: No acute abnormality.   Musculoskeletal: No acute osseous abnormality. No suspicious osseous lesion.   IMPRESSION: No acute or significant incidental extracardiac findings in the chest.     Electronically Signed   By: Maurine Simmering M.D.   On: 02/04/2022 12:00 _____________   History of Present Illness     [name] is a 66 y.o. male with NICM, HFrEF, HTN, HL, depression, leukemia, and thyroid nodule who presented to [facility] with chest pain. In 09/2008, he was admitted to [facility] w/ chest pain and dyspnea. Cath showed nl cors and an EF of 40%. Echo showed an EF of 30-40% w/ moderate diffuse HK. He was medically managed and subsequently f/u @ the [facility] but noted that he had not seen cardiology in several years.  About 5 years ago, he was admitted with chest pain to a hospital in [facility]. He underwent stress testing and believes that it was normal.   [name] lives locally and is reasonably active without symptoms or limitations.  He has been going to the [facility] about 3 times a week doing water aerobics and light weight lifting and denies chest pain or dyspnea.  He was in his usual state of health until the early evening of July 20, when he went out to his car and remembered that he forgot something.  As he turned and walked back to his apartment, he began experiencing profound dyspnea and fatigue.  He went and sat in his apartment for a little while and around 7 PM, he started noticing substernal chest discomfort that got up to 7/10.  This was associated with dyspnea, intermittent nausea, and mild lightheadedness.  Symptoms persisted throughout the night and he was very restless, sleeping very little.  Due to persistent symptoms, he presented to the  emergency department 02/01/22.  ECG showed sinus rhythm at 81 without acute ST or T changes.  Despite prolonged symptoms, troponin has been normal at 12  11.  Symptoms improved without intervention, though he continued to report ongoing discomfort.  He was hemodynamically stable and admitted to the cardiology service for further management.  Hospital Course   1.  Precordial chest pain - patient presented with an 11-hour history of substernal chest discomfort associated with dyspnea and lightheadedness, ruled out for MI - underwent echocardiogram showing EF 45-50%, G1DD, normal RV, mild LAE, moderate PR - underwent coronary CTA showing findings concerning for severe CAD in the mid LAD, with CAC 339 (86%ile) prompting cardiac cath - cardiac cath 02/04/22 showed nonobstructive CAD with mild irregularities of the LM, 30% mLAD, mild plaquing of the Cx, normal LVEDP - pain felt potentially related to acid reflux - PPI added, ibuprofen stopped off med rec - SL NTG not rx'd give no evidence of ACS, nonobstructive disease, and ongoing sildenafil use   2.  Nonischemic cardiomyopathy: Diagnosed in 2010 with an EF of 30 to 40% by echo at that time.  He was briefly followed by cardiology at the [facility] but says he has not been seen by cardiology in several years. BNP was normal and felt to be euvolemic on arrival - 2D echo  showed findings above - per discussion with Dr. Audie Box, will continue carvedilol and spironolactone at prior home doses, stop HCTZ, increase home Lasix to '20mg'$  daily, and add new Jardiance - recommend BMET at time of f/u given med adjustments  3. Essential hypertension - managed in context above  4. H/o leukemia - initial WBC 18.7, trend was flat at 11.02-23-12-10.2 - no signs of infection - continue follow-up at [facility]  5. Thyroid nodule - pt recalls having biopsy many years ago and being told it was benign but later went onto have chemotherapy and was told this was being monitored - CXR  suggestive leftward tracheal mass effect likely related to right-sided thyroid nodule which was previously noted on CT in 2010 - thyroid function labs were normal - recommended to f/u with primary care  6. Pulmonary regurgitation by echo - continue to follow as OP  F/u has been arranged 02/15/22 with Coletta Memos, NP. Dr. Audie Box has seen and examined the patient today and feels he is stable for discharge.  Did the patient have an acute coronary syndrome (MI, NSTEMI, STEMI, etc) this admission?:  No                               Did the patient have a percutaneous coronary intervention (stent / angioplasty)?:  No.    _____________  Discharge Vitals Blood pressure (!) 145/92, pulse (!) 57, temperature 97.6 F (36.4 C), temperature source Oral, resp. rate 16, height '5\' 9"'$  (1.753 m), weight 108.5 kg, SpO2 100 %.  Filed Weights   02/02/22 0238 02/04/22 0431  Weight: 108.5 kg 108.5 kg   Examined by MD - he will update note with examination.   Labs & Radiologic Studies    CBC Recent Labs    02/04/22 0358 02/05/22 0153  WBC 13.0* 10.2  HGB 15.1 13.3  HCT 44.3 40.6  MCV 90.0 91.6  PLT 213 706   Basic Metabolic Panel Recent Labs    02/04/22 0358 02/05/22 0153  NA 138 138  K 3.9 4.2  CL 102 105  CO2 28 26  GLUCOSE 108* 106*  BUN 8 10  CREATININE 1.11 1.24  CALCIUM 9.1 8.8*  MG 1.9 1.9   High Sensitivity Troponin:   Recent Labs  Lab 02/01/22 0716 02/01/22 1708  TROPONINIHS 12 11    Fasting Lipid Panel No results for input(s): "CHOL", "HDL", "LDLCALC", "TRIG", "CHOLHDL", "LDLDIRECT" in the last 72 hours.   _____________  CARDIAC CATHETERIZATION  Result Date: 02/04/2022 1.  Patent left main with mild irregularity 2.  Patent LAD with mild diffuse proximal to mid LAD stenosis estimated at 30% 3.  Patent left circumflex with mild nonobstructive plaquing 4.  Dominant RCA with mild irregularity and no significant stenosis 5.  Normal LVEDP Recommendations: Medical therapy  for nonobstructive CAD   CT CORONARY MORPH W/CTA COR W/SCORE W/CA W/CM &/OR WO/CM  Addendum Date: 02/04/2022   ADDENDUM REPORT: 02/04/2022 12:00 EXAM: OVER-READ INTERPRETATION  CT CHEST The following report is an over-read performed by radiologist Dr. Jason Nest [facility] on 02/04/2022. This over-read does not include interpretation of cardiac or coronary anatomy or pathology. The coronary CTA interpretation by the cardiologist is attached. COMPARISON:  CT 10/06/2008 FINDINGS: Vascular: Coronary calcium. No significant extracardiac vascular findings. Mediastinum/Nodes: No lymphadenopathy. Lungs/Pleura: Right middle lobe calcified granulomas. No focal airspace disease. No suspicious pulmonary nodules. Upper Abdomen: No acute abnormality. Musculoskeletal: No acute osseous abnormality. No  suspicious osseous lesion. IMPRESSION: No acute or significant incidental extracardiac findings in the chest. Electronically Signed   By: Maurine Simmering M.D.   On: 02/04/2022 12:00   Result Date: 02/04/2022 HISTORY: Chest pain, nonspecific EXAM: Cardiac/Coronary  CT TECHNIQUE: The patient was scanned on a [employer]. PROTOCOL: A 120 kV prospective scan was triggered in the descending thoracic aorta at 111 HU's. Axial non-contrast 3 mm slices were carried out through the heart. The data set was analyzed on a dedicated work station and scored using the Agatston method. Gantry rotation speed was 250 msecs and collimation was .6 mm. Beta blockade and 0.8 mg of sl NTG was given. The 3D data set was reconstructed in 5% intervals of the 35-75 % of the R-R cycle. Systolic and diastolic phases were analyzed on a dedicated work station using MPR, MIP and VRT modes. The patient received contrast: 174m OMNIPAQUE IOHEXOL 350 MG/ML SOLN. FINDINGS: Image quality: Good Noise artifact is: Limited Coronary calcium score is 339, which places the patient in the 86th percentile for age and sex matched control. Coronary  arteries: Normal coronary origins.  Right dominance. Right Coronary Artery: Minimal mixed atherosclerotic plaque in the proximal RCA, <25% stenosis. PLA patent without significant stenosis. PDA origin visualized but remainder of vessel truncated in z-axis coverage. Left Main Coronary Artery: Mild mixed atherosclerotic plaque in the distal left main coronary artery, 25-49% stenosis. Left Anterior Descending Coronary Artery: Moderate mixed atherosclerotic plaque in the proximal LAD, 50-69% stenosis with positive remodeling and low attenuation plaque which are high risk features. Severe mixed atherosclerotic plaque in the mid LAD, 70-99% stenosis and primarily calcified. Moderate atherosclerotic plaque in the mid LAD, 50-69% stenosis. Left Circumflex Artery: Minimal mixed atherosclerotic plaque in the distal LCx and proximal large caliber OM1, <25% stenosis. Aorta: Normal size, 32 mm at the mid ascending aorta (level of the PA bifurcation) measured double oblique. No calcifications. No dissection. Aortic Valve: No calcifications. AV calcium score 0 Other findings: Normal pulmonary vein drainage into the left atrium. Normal left atrial appendage without thrombus. Normal size of the pulmonary artery. IMPRESSION: 1. Severe CAD in the mid LAD, 70-99% stenosis, CADRADS 4. 2. Coronary calcium score is 339, which places the patient in the 86th percentile for age and sex matched control. 3. Normal coronary origins with right dominance. CT FFR will be sent however may not be available due to technical limitations of the scan. RECOMMENDATIONS: CAD-RADS 4 Severe stenosis. (70-99% or > 50% left main). Cardiac catheterization or CT FFR is recommended. Consider symptom-guided anti-ischemic pharmacotherapy as well as risk factor modification per guideline directed care. Electronically Signed: By: GCherlynn KaiserM.D. On: 02/02/2022 14:50   ECHOCARDIOGRAM COMPLETE  Result Date: 02/02/2022    ECHOCARDIOGRAM REPORT   Patient Name:    [name] Date of Exam: 02/02/2022 Medical Rec #:  [id number]    Height:       69.0 in Accession #:    [id number]   Weight:       239.2 lb Date of Birth:  307-06-1956     BSA:          2.229 m Patient Age:    61years      BP:           138/70 mmHg Patient Gender: M             HR:           54 bpm. Exam Location:  Inpatient  Procedure: 2D Echo, Cardiac Doppler and Color Doppler Indications:    Nonischemic Cardiomyopathy I42.8  History:        Patient has no prior history of Echocardiogram examinations.                 CHF; Risk Factors:Hypertension and Dyslipidemia.  Sonographer:    Darlina Sicilian RDCS Referring Phys: [place]  1. Left ventricular ejection fraction, by estimation, is 45 to 50%. The left ventricle has mildly decreased function. The left ventricle demonstrates global hypokinesis. Left ventricular diastolic parameters are consistent with Grade I diastolic dysfunction (impaired relaxation).  2. Right ventricular systolic function is normal. The right ventricular size is normal. Tricuspid regurgitation signal is inadequate for assessing PA pressure.  3. Left atrial size was mildly dilated.  4. The mitral valve is grossly normal. No evidence of mitral valve regurgitation. No evidence of mitral stenosis.  5. The aortic valve is tricuspid. Aortic valve regurgitation is not visualized. No aortic stenosis is present.  6. Pulmonic valve regurgitation is moderate. Comparison(s): No prior Echocardiogram. FINDINGS  Left Ventricle: Left ventricular ejection fraction, by estimation, is 45 to 50%. The left ventricle has mildly decreased function. The left ventricle demonstrates global hypokinesis. The left ventricular internal cavity size was normal in size. There is  no left ventricular hypertrophy. Left ventricular diastolic parameters are consistent with Grade I diastolic dysfunction (impaired relaxation). Right Ventricle: The right ventricular size is normal. No increase in  right ventricular wall thickness. Right ventricular systolic function is normal. Tricuspid regurgitation signal is inadequate for assessing PA pressure. Left Atrium: Left atrial size was mildly dilated. Right Atrium: Right atrial size was normal in size. Pericardium: Trivial pericardial effusion is present. The pericardial effusion is posterior to the left ventricle. Mitral Valve: The mitral valve is grossly normal. No evidence of mitral valve regurgitation. No evidence of mitral valve stenosis. Tricuspid Valve: The tricuspid valve is normal in structure. Tricuspid valve regurgitation is not demonstrated. No evidence of tricuspid stenosis. Aortic Valve: The aortic valve is tricuspid. Aortic valve regurgitation is not visualized. No aortic stenosis is present. Pulmonic Valve: The pulmonic valve was normal in structure. Pulmonic valve regurgitation is moderate. No evidence of pulmonic stenosis. Aorta: The aortic root and ascending aorta are structurally normal, with no evidence of dilitation. IAS/Shunts: No atrial level shunt detected by color flow Doppler.  LEFT VENTRICLE PLAX 2D LVIDd:         5.20 cm      Diastology LVIDs:         3.50 cm      LV e' medial:    7.40 cm/s LV PW:         0.90 cm      LV E/e' medial:  9.2 LV IVS:        0.90 cm      LV e' lateral:   9.90 cm/s LVOT diam:     2.20 cm      LV E/e' lateral: 6.9 LV SV:         73 LV SV Index:   33 LVOT Area:     3.80 cm  LV Volumes (MOD) LV vol d, MOD A2C: 135.0 ml LV vol d, MOD A4C: 171.0 ml LV vol s, MOD A2C: 68.4 ml LV vol s, MOD A4C: 81.2 ml LV SV MOD A2C:     66.6 ml LV SV MOD A4C:     171.0 ml LV SV MOD BP:  80.1 ml RIGHT VENTRICLE RV S prime:     13.10 cm/s TAPSE (M-mode): 2.6 cm LEFT ATRIUM             Index        RIGHT ATRIUM           Index LA diam:        3.50 cm 1.57 cm/m   RA Area:     25.20 cm LA Vol (A2C):   69.9 ml 31.36 ml/m  RA Volume:   88.50 ml  39.70 ml/m LA Vol (A4C):   78.9 ml 35.39 ml/m LA Biplane Vol: 75.3 ml 33.78 ml/m   AORTIC VALVE             PULMONIC VALVE LVOT Vmax:   87.80 cm/s  PR End Diast Vel: 2.16 msec LVOT Vmean:  58.300 cm/s LVOT VTI:    0.193 m  AORTA Ao Root diam: 3.00 cm Ao Asc diam:  3.20 cm MITRAL VALVE MV Area (PHT): 3.19 cm    SHUNTS MV Decel Time: 238 msec    Systemic VTI:  0.19 m MV E velocity: 68.40 cm/s  Systemic Diam: 2.20 cm MV A velocity: 59.70 cm/s MV E/A ratio:  1.15 Rudean Haskell MD Electronically signed by Rudean Haskell MD Signature Date/Time: 02/02/2022/2:07:19 PM    Final    DG Chest 2 View  Result Date: 02/01/2022 CLINICAL DATA:  Chest pain EXAM: CHEST - 2 VIEW COMPARISON:  12/10/2018 FINDINGS: Upper mediastinal widening with tracheal deviation to the left, there was a right-sided thyroid nodule on chest CT in 2010. no visible narrowing of the tracheal air column. There is no edema, consolidation, effusion, or pneumothorax. Normal heart size and aortic contours IMPRESSION: 1. No evidence of acute cardiopulmonary disease. 2. 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 Electronically Signed   By: Jorje Guild M.D.   On: 02/01/2022 07:40    Disposition   Pt is being discharged home today in good condition.  Follow-up Plans & Appointments     Follow-up Information     Deberah Pelton, NP Follow up.   Specialty: Cardiology Why: [place] location - a cardiology follow-up has been arranged for you on Friday Feb 15, 2022 at 2:20 PM (Arrive by 2:05 PM). Denyse Amass is one of the nurse practitioners that works with Dr. Ellyn Hack and our cardiology team. Contact information: [address] [place] [address] [phone]         Primary Care Provider Follow up.   Why: Your imaging at the hospital demonstrated concerns for a right sided thyroid nodule seen on a prior CT in 2010 as well. This looked to potentially be pushing your trachea (airway) to one side. Please follow up with your  primary care provider to discuss further management.               Discharge Instructions     Diet - low sodium heart healthy   Complete by: As directed    Discharge instructions   Complete by: As directed    Dr. Audie Box recommends you make the following medicine changes: - start empagliflozin (for your weak heart muscle) - start pantoprazole (for acid reflux) - increase furosemide (Lasix) to '20mg'$  daily - increase lisinopril to '40mg'$  daily - stop hydrochlorothiazide given the other medicine changes above - stop ibuprofen since this can trigger acid reflux  We were able to electronically prescribe your new prescriptions  but please don't hesitate to call our office at [phone] if there are any issues when you pick these up.  Melatonin was removed from your medicine list since you indicated you are no longer taking this.  Please follow up with your primary care provider to discuss your thyroid nodule. Your chest x-ray showed "Leftward tracheal mass effect likely related to a right-sided thyroid nodule noted by 2010 chest CT."   Increase activity slowly   Complete by: As directed    No driving for 2 days. No lifting over 5 lbs for 1 week. No sexual activity for 1 week. Keep procedure site clean & dry. If you notice increased pain, swelling, bleeding or pus, call/return!  You may shower, but no soaking baths/hot tubs/pools for 1 week.       Discharge Medications   Allergies as of 02/05/2022       Reactions   Penicillins Palpitations        Medication List     STOP taking these medications    hydrochlorothiazide 25 MG tablet Commonly known as: HYDRODIURIL   ibuprofen 800 MG tablet Commonly known as: ADVIL       TAKE these medications    acetaminophen 500 MG tablet Commonly known as: TYLENOL Take 1,000 mg by mouth every 4 (four) hours as needed for mild pain.   aspirin EC 81 MG tablet Take 81 mg by mouth daily.   atorvastatin 80 MG tablet Commonly known  as: LIPITOR Take 80 mg by mouth daily.   carvedilol 25 MG tablet Commonly known as: COREG Take 12.5 mg by mouth 2 (two) times daily.   cetirizine 10 MG tablet Commonly known as: ZYRTEC Take 10 mg by mouth daily.   cholecalciferol 10 MCG (400 UNIT) Tabs tablet Commonly known as: VITAMIN D3 Take 400 Units by mouth daily.   docusate sodium 100 MG capsule Commonly known as: COLACE Take 100 mg by mouth 2 (two) times daily.   DULoxetine 60 MG capsule Commonly known as: CYMBALTA Take 60 mg by mouth daily.   empagliflozin 10 MG Tabs tablet Commonly known as: JARDIANCE Take 1 tablet (10 mg total) by mouth daily.   furosemide 20 MG tablet Commonly known as: LASIX Take 1 tablet (20 mg total) by mouth daily. What changed: how much to take   lisinopril 40 MG tablet Commonly known as: ZESTRIL Take 1 tablet (40 mg total) by mouth daily. What changed:  medication strength how much to take   pantoprazole 40 MG tablet Commonly known as: PROTONIX Take 1 tablet (40 mg total) by mouth daily.   sildenafil 100 MG tablet Commonly known as: VIAGRA Take 50 mg by mouth daily as needed for erectile dysfunction.   spironolactone 25 MG tablet Commonly known as: ALDACTONE Take 25 mg by mouth daily.   traMADol 50 MG tablet Commonly known as: ULTRAM Take 50 mg by mouth every 6 (six) hours as needed for moderate pain.           Outstanding Labs/Studies   N/A  Duration of Discharge Encounter   Greater than 30 minutes including physician time.  Signed, Charlie Pitter, PA-C 02/05/2022, 8:53 AM

## 2022-02-02 NOTE — Progress Notes (Addendum)
Progress Note  Patient Name: [name] Date of Encounter: 02/02/2022  [facility] Cardiologist: Glenetta Hew, MD   Subjective   NAEO.  Chest pain-free.  Inpatient Medications    Scheduled Meds:  aspirin EC  81 mg Oral Daily   atorvastatin  80 mg Oral Daily   carvedilol  3.125 mg Oral BID WC   DULoxetine  60 mg Oral Daily   heparin  5,000 Units Subcutaneous Q8H   lidocaine  1 patch Transdermal Q24H   lisinopril  20 mg Oral Daily   spironolactone  25 mg Oral Daily   Continuous Infusions:  PRN Meds: acetaminophen, melatonin, nitroGLYCERIN, ondansetron (ZOFRAN) IV, traMADol   Vital Signs    Vitals:   02/02/22 0234 02/02/22 0238 02/02/22 0506 02/02/22 0803  BP: (!) 147/76  (!) 114/101 138/70  Pulse: (!) 57  63 (!) 53  Resp: '16  16 15  '$ Temp: (!) 97.5 F (36.4 C)  97.8 F (36.6 C) 97.7 F (36.5 C)  TempSrc: Oral  Oral Oral  SpO2: 99%  100% 100%  Weight:  108.5 kg    Height:  '5\' 9"'$  (1.753 m)     No intake or output data in the 24 hours ending 02/02/22 1103    02/02/2022    2:38 AM 06/19/2020   10:19 AM  Last 3 Weights  Weight (lbs) 239 lb 3.2 oz 234 lb  Weight (kg) 108.5 kg 106.142 kg      Telemetry    Sinus with PVCs - Personally Reviewed  ECG    Personally Reviewed  Physical Exam   GEN: No acute distress.  Appears older than stated age. Neck: No JVD Cardiac: RRR, no murmurs, rubs, or gallops.  Respiratory: Clear to auscultation bilaterally. GI: Soft, nontender, non-distended  MS: No edema; No deformity. Neuro:  Nonfocal  Psych: Normal affect   Labs    High Sensitivity Troponin:   Recent Labs  Lab 02/01/22 0716 02/01/22 1708  TROPONINIHS 12 11     Chemistry Recent Labs  Lab 02/01/22 0716 02/02/22 0210  NA 139  --   K 3.5  --   CL 105  --   CO2 25  --   GLUCOSE 126*  --   BUN 7*  --   CREATININE 1.20 1.02  CALCIUM 9.1  --   GFRNONAA >60 >60  ANIONGAP 9  --     Lipids  Recent Labs  Lab 02/02/22 0210  CHOL 77  TRIG  41  HDL 28*  LDLCALC 41  CHOLHDL 2.8    Hematology Recent Labs  Lab 02/01/22 0716 02/02/22 0210  WBC 18.7* 11.8*  RBC 4.77 3.82*  HGB 14.3 11.7*  HCT 43.4 35.4*  MCV 91.0 92.7  MCH 30.0 30.6  MCHC 32.9 33.1  RDW 12.4 12.7  PLT 211 145*   Thyroid No results for input(s): "TSH", "FREET4" in the last 168 hours.  BNP Recent Labs  Lab 02/01/22 0716  BNP 13.5    DDimer No results for input(s): "DDIMER" in the last 168 hours.   Radiology    DG Chest 2 View  Result Date: 02/01/2022 CLINICAL DATA:  Chest pain EXAM: CHEST - 2 VIEW COMPARISON:  12/10/2018 FINDINGS: Upper mediastinal widening with tracheal deviation to the left, there was a right-sided thyroid nodule on chest CT in 2010. no visible narrowing of the tracheal air column. There is no edema, consolidation, effusion, or pneumothorax. Normal heart size and aortic contours IMPRESSION: 1. No evidence of acute  cardiopulmonary disease. 2. 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 Electronically Signed   By: Jorje Guild M.D.   On: 02/01/2022 07:40      Assessment & Plan    Mr Fennell is a 66yo man who presented with chest pain with stable troponins. He carries a diagnosis of NICM.  #Chest pain Unclear etiology. Troponins flat. CTA Pending  #Hx of NICM Repeat echo pending Cont bb and ACE-I  #HTN     For questions or updates, please contact [place] Please consult [email or web address] for contact info under        Signed, Vickie Epley, MD  02/02/2022, 11:03 AM     -------------------  Addendum 5:09 PM  Patient with CTA results as below concerning for severe stenosis in the LAD.  Plan to monitor him over the weekend and perform left heart catheterization prior to discharge.  IMPRESSION: 1. Severe CAD in the mid LAD, 70-99% stenosis, CADRADS 4. 2. Coronary calcium score is 339, which places the patient in the 86th percentile for age and sex  matched control. 3. Normal coronary origins with right dominance.   Lysbeth Galas T. Quentin Ore, MD, [facility], [facility] Cardiac Electrophysiology

## 2022-02-03 DIAGNOSIS — R072 Precordial pain: Secondary | ICD-10-CM | POA: Diagnosis not present

## 2022-02-03 LAB — CBC
HCT: 42.3 % (ref 39.0–52.0)
Hemoglobin: 14.5 g/dL (ref 13.0–17.0)
MCH: 30.7 pg (ref 26.0–34.0)
MCHC: 34.3 g/dL (ref 30.0–36.0)
MCV: 89.4 fL (ref 80.0–100.0)
Platelets: 199 10*3/uL (ref 150–400)
RBC: 4.73 MIL/uL (ref 4.22–5.81)
RDW: 12.1 % (ref 11.5–15.5)
WBC: 11 10*3/uL — ABNORMAL HIGH (ref 4.0–10.5)
nRBC: 0 % (ref 0.0–0.2)

## 2022-02-03 LAB — BASIC METABOLIC PANEL
Anion gap: 8 (ref 5–15)
BUN: 12 mg/dL (ref 8–23)
CO2: 26 mmol/L (ref 22–32)
Calcium: 9 mg/dL (ref 8.9–10.3)
Chloride: 102 mmol/L (ref 98–111)
Creatinine, Ser: 1.14 mg/dL (ref 0.61–1.24)
GFR, Estimated: >60 mL/min (ref >60)
Glucose, Bld: 107 mg/dL — ABNORMAL HIGH (ref 70–99)
Potassium: 3.6 mmol/L (ref 3.5–5.1)
Sodium: 136 mmol/L (ref 135–145)

## 2022-02-03 LAB — MAGNESIUM: Magnesium: 1.8 mg/dL (ref 1.7–2.4)

## 2022-02-03 MED ORDER — SODIUM CHLORIDE 0.9 % IV SOLN: 250.0000 mL | INTRAVENOUS | Status: DC | PRN

## 2022-02-03 MED ORDER — SODIUM CHLORIDE 0.9 % WEIGHT BASED INFUSION
3.0000 mL/kg/h | INTRAVENOUS | Status: DC
  Administered 2022-02-04: 3 mL/kg/h via INTRAVENOUS

## 2022-02-03 MED ORDER — SODIUM CHLORIDE 0.9% FLUSH: 3.0000 mL | INTRAVENOUS | Status: DC | PRN

## 2022-02-03 MED ORDER — SODIUM CHLORIDE 0.9 % WEIGHT BASED INFUSION
1.0000 mL/kg/h | INTRAVENOUS | Status: DC
  Administered 2022-02-04: 1 mL/kg/h via INTRAVENOUS

## 2022-02-03 MED ORDER — SODIUM CHLORIDE 0.9% FLUSH
3.0000 mL | Freq: Two times a day (BID) | INTRAVENOUS | Status: DC
  Administered 2022-02-03 – 2022-02-05 (×3): 3 mL via INTRAVENOUS

## 2022-02-03 MED ORDER — ASPIRIN 81 MG PO CHEW
81.0000 mg | CHEWABLE_TABLET | ORAL | Status: AC
  Administered 2022-02-04: 81 mg via ORAL
  Filled 2022-02-03: qty 1

## 2022-02-03 NOTE — Progress Notes (Signed)
Progress Note  Patient Name: LINN GOETZE Date of Encounter: 02/03/2022  [facility] Cardiologist: Glenetta Hew, MD   Subjective   NAEO.  CTA yesterday with significant disease.  Inpatient Medications    Scheduled Meds:  aspirin EC  81 mg Oral Daily   atorvastatin  80 mg Oral Daily   carvedilol  3.125 mg Oral BID WC   DULoxetine  60 mg Oral Daily   heparin  5,000 Units Subcutaneous Q8H   lidocaine  1 patch Transdermal Q24H   lisinopril  20 mg Oral Daily   spironolactone  25 mg Oral Daily   Continuous Infusions:  PRN Meds: acetaminophen, melatonin, nitroGLYCERIN, ondansetron (ZOFRAN) IV, traMADol   Vital Signs    Vitals:   02/02/22 0506 02/02/22 0803 02/02/22 1131 02/02/22 2138  BP: (!) 114/101 138/70 138/77 134/87  Pulse: 63 (!) 53 60 63  Resp: '16 15 13 18  '$ Temp: 97.8 F (36.6 C) 97.7 F (36.5 C) 97.7 F (36.5 C) 98.4 F (36.9 C)  TempSrc: Oral Oral Oral Oral  SpO2: 100% 100%  99%  Weight:      Height:       No intake or output data in the 24 hours ending 02/03/22 0746    02/02/2022    2:38 AM 06/19/2020   10:19 AM  Last 3 Weights  Weight (lbs) 239 lb 3.2 oz 234 lb  Weight (kg) 108.5 kg 106.142 kg      Telemetry    Sinus with PVCs - Personally Reviewed  ECG    Personally Reviewed  Physical Exam   GEN: No acute distress.  Appears older than stated age. Neck: No JVD Cardiac: RRR, no murmurs, rubs, or gallops.  Respiratory: Clear to auscultation bilaterally. GI: Soft, nontender, non-distended  MS: No edema; No deformity. Neuro:  Nonfocal  Psych: Normal affect   Labs    High Sensitivity Troponin:   Recent Labs  Lab 02/01/22 0716 02/01/22 1708  TROPONINIHS 12 11      Chemistry Recent Labs  Lab 02/01/22 0716 02/02/22 0210  NA 139  --   K 3.5  --   CL 105  --   CO2 25  --   GLUCOSE 126*  --   BUN 7*  --   CREATININE 1.20 1.02  CALCIUM 9.1  --   GFRNONAA >60 >60  ANIONGAP 9  --      Lipids  Recent Labs  Lab  02/02/22 0210  CHOL 77  TRIG 41  HDL 28*  LDLCALC 41  CHOLHDL 2.8     Hematology Recent Labs  Lab 02/01/22 0716 02/02/22 0210  WBC 18.7* 11.8*  RBC 4.77 3.82*  HGB 14.3 11.7*  HCT 43.4 35.4*  MCV 91.0 92.7  MCH 30.0 30.6  MCHC 32.9 33.1  RDW 12.4 12.7  PLT 211 145*    Thyroid  Recent Labs  Lab 02/02/22 1216  TSH 0.650  FREET4 0.92    BNP Recent Labs  Lab 02/01/22 0716  BNP 13.5     DDimer No results for input(s): "DDIMER" in the last 168 hours.   Radiology    CT CORONARY MORPH W/CTA COR W/SCORE W/CA W/CM &/OR WO/CM  Result Date: 02/02/2022 HISTORY: Chest pain, nonspecific EXAM: Cardiac/Coronary  CT TECHNIQUE: The patient was scanned on a [employer]. PROTOCOL: A 120 kV prospective scan was triggered in the descending thoracic aorta at 111 HU's. Axial non-contrast 3 mm slices were carried out through the heart. The data set  was analyzed on a dedicated work station and scored using the Agatston method. Gantry rotation speed was 250 msecs and collimation was .6 mm. Beta blockade and 0.8 mg of sl NTG was given. The 3D data set was reconstructed in 5% intervals of the 35-75 % of the R-R cycle. Systolic and diastolic phases were analyzed on a dedicated work station using MPR, MIP and VRT modes. The patient received contrast: 153m OMNIPAQUE IOHEXOL 350 MG/ML SOLN. FINDINGS: Image quality: Good Noise artifact is: Limited Coronary calcium score is 339, which places the patient in the 86th percentile for age and sex matched control. Coronary arteries: Normal coronary origins.  Right dominance. Right Coronary Artery: Minimal mixed atherosclerotic plaque in the proximal RCA, <25% stenosis. PLA patent without significant stenosis. PDA origin visualized but remainder of vessel truncated in z-axis coverage. Left Main Coronary Artery: Mild mixed atherosclerotic plaque in the distal left main coronary artery, 25-49% stenosis. Left Anterior Descending Coronary Artery: Moderate  mixed atherosclerotic plaque in the proximal LAD, 50-69% stenosis with positive remodeling and low attenuation plaque which are high risk features. Severe mixed atherosclerotic plaque in the mid LAD, 70-99% stenosis and primarily calcified. Moderate atherosclerotic plaque in the mid LAD, 50-69% stenosis. Left Circumflex Artery: Minimal mixed atherosclerotic plaque in the distal LCx and proximal large caliber OM1, <25% stenosis. Aorta: Normal size, 32 mm at the mid ascending aorta (level of the PA bifurcation) measured double oblique. No calcifications. No dissection. Aortic Valve: No calcifications. AV calcium score 0 Other findings: Normal pulmonary vein drainage into the left atrium. Normal left atrial appendage without thrombus. Normal size of the pulmonary artery. IMPRESSION: 1. Severe CAD in the mid LAD, 70-99% stenosis, CADRADS 4. 2. Coronary calcium score is 339, which places the patient in the 86th percentile for age and sex matched control. 3. Normal coronary origins with right dominance. CT FFR will be sent however may not be available due to technical limitations of the scan. RECOMMENDATIONS: CAD-RADS 4 Severe stenosis. (70-99% or > 50% left main). Cardiac catheterization or CT FFR is recommended. Consider symptom-guided anti-ischemic pharmacotherapy as well as risk factor modification per guideline directed care. Electronically Signed   By: GCherlynn KaiserM.D.   On: 02/02/2022 14:50   ECHOCARDIOGRAM COMPLETE  Result Date: 02/02/2022    ECHOCARDIOGRAM REPORT   Patient Name:   Hanley A Lookingbill Date of Exam: 02/02/2022 Medical Rec #:  [id number]    Height:       69.0 in Accession #:    [id number]   Weight:       239.2 lb Date of Birth:  [birth year]     BSA:          2.229 m Patient Age:    66years      BP:           138/70 mmHg Patient Gender: M             HR:           54 bpm. Exam Location:  Inpatient Procedure: 2D Echo, Cardiac Doppler and Color Doppler Indications:    Nonischemic Cardiomyopathy  I42.8  History:        Patient has no prior history of Echocardiogram examinations.                 CHF; Risk Factors:Hypertension and Dyslipidemia.  Sonographer:    TDarlina SicilianRDCS Referring Phys: 3[place] 1. Left ventricular ejection fraction, by estimation, is  45 to 50%. The left ventricle has mildly decreased function. The left ventricle demonstrates global hypokinesis. Left ventricular diastolic parameters are consistent with Grade I diastolic dysfunction (impaired relaxation).  2. Right ventricular systolic function is normal. The right ventricular size is normal. Tricuspid regurgitation signal is inadequate for assessing PA pressure.  3. Left atrial size was mildly dilated.  4. The mitral valve is grossly normal. No evidence of mitral valve regurgitation. No evidence of mitral stenosis.  5. The aortic valve is tricuspid. Aortic valve regurgitation is not visualized. No aortic stenosis is present.  6. Pulmonic valve regurgitation is moderate. Comparison(s): No prior Echocardiogram. FINDINGS  Left Ventricle: Left ventricular ejection fraction, by estimation, is 45 to 50%. The left ventricle has mildly decreased function. The left ventricle demonstrates global hypokinesis. The left ventricular internal cavity size was normal in size. There is  no left ventricular hypertrophy. Left ventricular diastolic parameters are consistent with Grade I diastolic dysfunction (impaired relaxation). Right Ventricle: The right ventricular size is normal. No increase in right ventricular wall thickness. Right ventricular systolic function is normal. Tricuspid regurgitation signal is inadequate for assessing PA pressure. Left Atrium: Left atrial size was mildly dilated. Right Atrium: Right atrial size was normal in size. Pericardium: Trivial pericardial effusion is present. The pericardial effusion is posterior to the left ventricle. Mitral Valve: The mitral valve is grossly normal. No  evidence of mitral valve regurgitation. No evidence of mitral valve stenosis. Tricuspid Valve: The tricuspid valve is normal in structure. Tricuspid valve regurgitation is not demonstrated. No evidence of tricuspid stenosis. Aortic Valve: The aortic valve is tricuspid. Aortic valve regurgitation is not visualized. No aortic stenosis is present. Pulmonic Valve: The pulmonic valve was normal in structure. Pulmonic valve regurgitation is moderate. No evidence of pulmonic stenosis. Aorta: The aortic root and ascending aorta are structurally normal, with no evidence of dilitation. IAS/Shunts: No atrial level shunt detected by color flow Doppler.  LEFT VENTRICLE PLAX 2D LVIDd:         5.20 cm      Diastology LVIDs:         3.50 cm      LV e' medial:    7.40 cm/s LV PW:         0.90 cm      LV E/e' medial:  9.2 LV IVS:        0.90 cm      LV e' lateral:   9.90 cm/s LVOT diam:     2.20 cm      LV E/e' lateral: 6.9 LV SV:         73 LV SV Index:   33 LVOT Area:     3.80 cm  LV Volumes (MOD) LV vol d, MOD A2C: 135.0 ml LV vol d, MOD A4C: 171.0 ml LV vol s, MOD A2C: 68.4 ml LV vol s, MOD A4C: 81.2 ml LV SV MOD A2C:     66.6 ml LV SV MOD A4C:     171.0 ml LV SV MOD BP:      80.1 ml RIGHT VENTRICLE RV S prime:     13.10 cm/s TAPSE (M-mode): 2.6 cm LEFT ATRIUM             Index        RIGHT ATRIUM           Index LA diam:        3.50 cm 1.57 cm/m   RA Area:     25.20 cm LA  Vol (A2C):   69.9 ml 31.36 ml/m  RA Volume:   88.50 ml  39.70 ml/m LA Vol (A4C):   78.9 ml 35.39 ml/m LA Biplane Vol: 75.3 ml 33.78 ml/m  AORTIC VALVE             PULMONIC VALVE LVOT Vmax:   87.80 cm/s  PR End Diast Vel: 2.16 msec LVOT Vmean:  58.300 cm/s LVOT VTI:    0.193 m  AORTA Ao Root diam: 3.00 cm Ao Asc diam:  3.20 cm MITRAL VALVE MV Area (PHT): 3.19 cm    SHUNTS MV Decel Time: 238 msec    Systemic VTI:  0.19 m MV E velocity: 68.40 cm/s  Systemic Diam: 2.20 cm MV A velocity: 59.70 cm/s MV E/A ratio:  1.15 Rudean Haskell MD Electronically  signed by Rudean Haskell MD Signature Date/Time: 02/02/2022/2:07:19 PM    Final       Assessment & Plan    Mr Saraceno is a 66yo man who presented with chest pain with stable troponins. He carries a diagnosis of NICM.  #Chest pain #CAD Coronary CTA shows significant LAD dz. Planning for [facility] Monday. - keep NPO Sunday night - ASA, statin  #Hx of NICM NYHA II. EF 45 this admission.  Repeat echo pending - Cont bb and ACE-I and spironolactone  #HTN     For questions or updates, please contact [place] Please consult [email or web address] for contact info under        Signed, Vickie Epley, MD  02/03/2022, 7:46 AM

## 2022-02-03 NOTE — Progress Notes (Signed)
I have explained to the patient about abnormal coronary CT on the night of 02/02/2022.  We discussed possibility of cardiac catheterization including 1 in 200 chance of major bleeding, 1 in 500 chance vascular injury, renal injury, and arrhythmia, 1:1000 chance of heart attack, stroke, loss of life or limb.  He understands benefit of the procedure outweigh the potential risk and agreeable to proceed.

## 2022-02-03 NOTE — Progress Notes (Addendum)
Cath orders written per MD request - he spoke with pt about procedure details. Also put on add on board for Mon.

## 2022-02-04 ENCOUNTER — Other Ambulatory Visit (HOSPITAL_COMMUNITY): Payer: Self-pay

## 2022-02-04 ENCOUNTER — Telehealth (HOSPITAL_COMMUNITY): Payer: Self-pay | Admitting: Pharmacy Technician

## 2022-02-04 ENCOUNTER — Encounter (HOSPITAL_COMMUNITY)
Admission: EM | Disposition: A | Discharge status: Home / Self Care | Payer: Self-pay | Source: Home / Self Care | Attending: Cardiology

## 2022-02-04 DIAGNOSIS — I2510 Atherosclerotic heart disease of native coronary artery without angina pectoris: Secondary | ICD-10-CM

## 2022-02-04 DIAGNOSIS — R072 Precordial pain: Secondary | ICD-10-CM | POA: Diagnosis not present

## 2022-02-04 HISTORY — PX: LEFT HEART CATH AND CORONARY ANGIOGRAPHY: CATH118249

## 2022-02-04 LAB — CBC
HCT: 44.3 % (ref 39.0–52.0)
Hemoglobin: 15.1 g/dL (ref 13.0–17.0)
MCH: 30.7 pg (ref 26.0–34.0)
MCHC: 34.1 g/dL (ref 30.0–36.0)
MCV: 90 fL (ref 80.0–100.0)
Platelets: 213 10*3/uL (ref 150–400)
RBC: 4.92 MIL/uL (ref 4.22–5.81)
RDW: 12.2 % (ref 11.5–15.5)
WBC: 13 10*3/uL — ABNORMAL HIGH (ref 4.0–10.5)
nRBC: 0 % (ref 0.0–0.2)

## 2022-02-04 LAB — BASIC METABOLIC PANEL
Anion gap: 8 (ref 5–15)
BUN: 8 mg/dL (ref 8–23)
CO2: 28 mmol/L (ref 22–32)
Calcium: 9.1 mg/dL (ref 8.9–10.3)
Chloride: 102 mmol/L (ref 98–111)
Creatinine, Ser: 1.11 mg/dL (ref 0.61–1.24)
GFR, Estimated: >60 mL/min (ref >60)
Glucose, Bld: 108 mg/dL — ABNORMAL HIGH (ref 70–99)
Potassium: 3.9 mmol/L (ref 3.5–5.1)
Sodium: 138 mmol/L (ref 135–145)

## 2022-02-04 LAB — LIPOPROTEIN A (LPA): Lipoprotein (a): 121.3 nmol/L — ABNORMAL HIGH (ref <75.0)

## 2022-02-04 LAB — MAGNESIUM: Magnesium: 1.9 mg/dL (ref 1.7–2.4)

## 2022-02-04 SURGERY — LEFT HEART CATH AND CORONARY ANGIOGRAPHY
Anesthesia: LOCAL

## 2022-02-04 MED ORDER — LIDOCAINE HCL (PF) 1 % IJ SOLN
INTRAMUSCULAR | Status: AC
  Filled 2022-02-04: qty 30

## 2022-02-04 MED ORDER — LABETALOL HCL 5 MG/ML IV SOLN: 10.0000 mg | INTRAVENOUS | Status: AC | PRN

## 2022-02-04 MED ORDER — IOHEXOL 350 MG/ML SOLN
INTRAVENOUS | Status: DC | PRN
  Administered 2022-02-04: 50 mL

## 2022-02-04 MED ORDER — SODIUM CHLORIDE 0.9% FLUSH: 3.0000 mL | INTRAVENOUS | Status: DC | PRN

## 2022-02-04 MED ORDER — HEPARIN (PORCINE) IN NACL 1000-0.9 UT/500ML-% IV SOLN
INTRAVENOUS | Status: AC
  Filled 2022-02-04: qty 1000

## 2022-02-04 MED ORDER — SODIUM CHLORIDE 0.9% FLUSH
3.0000 mL | Freq: Two times a day (BID) | INTRAVENOUS | Status: DC
  Administered 2022-02-05: 3 mL via INTRAVENOUS

## 2022-02-04 MED ORDER — LIDOCAINE HCL (PF) 1 % IJ SOLN
INTRAMUSCULAR | Status: DC | PRN
  Administered 2022-02-04: 2 mL

## 2022-02-04 MED ORDER — VERAPAMIL HCL 2.5 MG/ML IV SOLN
INTRAVENOUS | Status: DC | PRN
  Administered 2022-02-04: 10 mL via INTRA_ARTERIAL

## 2022-02-04 MED ORDER — VERAPAMIL HCL 2.5 MG/ML IV SOLN
INTRAVENOUS | Status: AC
  Filled 2022-02-04: qty 2

## 2022-02-04 MED ORDER — MIDAZOLAM HCL 2 MG/2ML IJ SOLN
INTRAMUSCULAR | Status: DC | PRN
  Administered 2022-02-04: 2 mg via INTRAVENOUS

## 2022-02-04 MED ORDER — HEPARIN SODIUM (PORCINE) 1000 UNIT/ML IJ SOLN
INTRAMUSCULAR | Status: AC
  Filled 2022-02-04: qty 10

## 2022-02-04 MED ORDER — HEPARIN (PORCINE) IN NACL 1000-0.9 UT/500ML-% IV SOLN
INTRAVENOUS | Status: DC | PRN
  Administered 2022-02-04 (×4): 500 mL

## 2022-02-04 MED ORDER — DAPAGLIFLOZIN PROPANEDIOL 10 MG PO TABS
10.0000 mg | ORAL_TABLET | Freq: Every day | ORAL | Status: DC
  Administered 2022-02-04: 10 mg via ORAL
  Filled 2022-02-04: qty 1

## 2022-02-04 MED ORDER — FENTANYL CITRATE (PF) 100 MCG/2ML IJ SOLN
INTRAMUSCULAR | Status: AC
  Filled 2022-02-04: qty 2

## 2022-02-04 MED ORDER — SODIUM CHLORIDE 0.9 % WEIGHT BASED INFUSION
1.0000 mL/kg/h | INTRAVENOUS | Status: AC
  Administered 2022-02-04: 1 mL/kg/h via INTRAVENOUS

## 2022-02-04 MED ORDER — SODIUM CHLORIDE 0.9 % IV SOLN: 250.0000 mL | INTRAVENOUS | Status: DC | PRN

## 2022-02-04 MED ORDER — LISINOPRIL 20 MG PO TABS
40.0000 mg | ORAL_TABLET | Freq: Every day | ORAL | Status: DC
  Administered 2022-02-04 – 2022-02-05 (×2): 40 mg via ORAL
  Filled 2022-02-04 (×2): qty 2

## 2022-02-04 MED ORDER — HYDRALAZINE HCL 20 MG/ML IJ SOLN: 10.0000 mg | INTRAMUSCULAR | Status: AC | PRN

## 2022-02-04 MED ORDER — FENTANYL CITRATE (PF) 100 MCG/2ML IJ SOLN
INTRAMUSCULAR | Status: DC | PRN
  Administered 2022-02-04: 25 ug via INTRAVENOUS

## 2022-02-04 MED ORDER — MIDAZOLAM HCL 2 MG/2ML IJ SOLN
INTRAMUSCULAR | Status: AC
  Filled 2022-02-04: qty 2

## 2022-02-04 MED ORDER — HEPARIN SODIUM (PORCINE) 1000 UNIT/ML IJ SOLN
INTRAMUSCULAR | Status: DC | PRN
  Administered 2022-02-04: 5500 [IU] via INTRAVENOUS

## 2022-02-04 MED ORDER — EMPAGLIFLOZIN 10 MG PO TABS
10.0000 mg | ORAL_TABLET | Freq: Every day | ORAL | Status: DC
  Administered 2022-02-05: 10 mg via ORAL
  Filled 2022-02-04: qty 1

## 2022-02-04 SURGICAL SUPPLY — 9 items
BAND ZEPHYR COMPRESS 30 LONG (HEMOSTASIS) ×1 IMPLANT
CATH 5FR JL3.5 JR4 ANG PIG MP (CATHETERS) ×1 IMPLANT
GLIDESHEATH SLEND SS 6F .021 (SHEATH) ×1 IMPLANT
GUIDEWIRE INQWIRE 1.5J.035X260 (WIRE) IMPLANT
INQWIRE 1.5J .035X260CM (WIRE) ×2
KIT HEART LEFT (KITS) ×2 IMPLANT
PACK CARDIAC CATHETERIZATION (CUSTOM PROCEDURE TRAY) ×2 IMPLANT
TRANSDUCER W/STOPCOCK (MISCELLANEOUS) ×2 IMPLANT
TUBING CIL FLEX 10 FLL-RA (TUBING) ×2 IMPLANT

## 2022-02-04 NOTE — Telephone Encounter (Signed)
Pharmacy Patient Advocate Encounter  Insurance verification completed.    The patient gets his medications through the [place]  The patient is currently admitted and ran test claims for the following: Farxiga,Jardiance.  Copays and coinsurance results were relayed to Inpatient clinical team.

## 2022-02-04 NOTE — H&P (View-Only) (Signed)
Cardiology Progress Note  Patient ID: [name] MRN: [id number] DOB: 04-Jun-1956 Date of Encounter: 02/04/2022  Primary Cardiologist: Glenetta Hew, MD  Subjective   Chief Complaint: None.  HPI: None. LHC today.   ROS:  All other ROS reviewed and negative. Pertinent positives noted in the HPI.     Inpatient Medications  Scheduled Meds:  aspirin EC  81 mg Oral Daily   atorvastatin  80 mg Oral Daily   carvedilol  3.125 mg Oral BID WC   dapagliflozin propanediol  10 mg Oral Daily   DULoxetine  60 mg Oral Daily   heparin  5,000 Units Subcutaneous Q8H   lidocaine  1 patch Transdermal Q24H   lisinopril  40 mg Oral Daily   sodium chloride flush  3 mL Intravenous Q12H   spironolactone  25 mg Oral Daily   Continuous Infusions:  sodium chloride     sodium chloride 1 mL/kg/hr (02/04/22 0547)   PRN Meds: sodium chloride, acetaminophen, melatonin, nitroGLYCERIN, ondansetron (ZOFRAN) IV, sodium chloride flush, traMADol   Vital Signs   Vitals:   02/03/22 1014 02/03/22 1243 02/03/22 2029 02/04/22 0431  BP: 132/73 138/80 138/82 (!) 154/95  Pulse:  63 62 60  Resp: '18 18 18   '$ Temp: (!) 97.5 F (36.4 C) 98.6 F (37 C) 98.3 F (36.8 C) 98.3 F (36.8 C)  TempSrc: Oral Oral Oral Oral  SpO2:   98% 99%  Weight:    108.5 kg  Height:       No intake or output data in the 24 hours ending 02/04/22 0717    02/04/2022    4:31 AM 02/02/2022    2:38 AM 06/19/2020   10:19 AM  Last 3 Weights  Weight (lbs) 239 lb 4.8 oz 239 lb 3.2 oz 234 lb  Weight (kg) 108.546 kg 108.5 kg 106.142 kg      Telemetry  Overnight telemetry shows SR 60s, which I personally reviewed.   ECG  The most recent ECG shows SR 81 bpm, LVH, which I personally reviewed.   Physical Exam   Vitals:   02/03/22 1014 02/03/22 1243 02/03/22 2029 02/04/22 0431  BP: 132/73 138/80 138/82 (!) 154/95  Pulse:  63 62 60  Resp: '18 18 18   '$ Temp: (!) 97.5 F (36.4 C) 98.6 F (37 C) 98.3 F (36.8 C) 98.3 F (36.8 C)   TempSrc: Oral Oral Oral Oral  SpO2:   98% 99%  Weight:    108.5 kg  Height:       No intake or output data in the 24 hours ending 02/04/22 0717     02/04/2022    4:31 AM 02/02/2022    2:38 AM 06/19/2020   10:19 AM  Last 3 Weights  Weight (lbs) 239 lb 4.8 oz 239 lb 3.2 oz 234 lb  Weight (kg) 108.546 kg 108.5 kg 106.142 kg    Body mass index is 35.34 kg/m.  General: Well nourished, well developed, in no acute distress Head: Atraumatic, normal size  Eyes: PEERLA, EOMI  Neck: Supple, no JVD Endocrine: No thryomegaly Cardiac: Normal S1, S2; RRR; no murmurs, rubs, or gallops Lungs: Clear to auscultation bilaterally, no wheezing, rhonchi or rales  Abd: Soft, nontender, no hepatomegaly  Ext: No edema, pulses 2+ Musculoskeletal: No deformities, BUE and BLE strength normal and equal Skin: Warm and dry, no rashes   Neuro: Alert and oriented to person, place, time, and situation, CNII-XII grossly intact, no focal deficits  Psych: Normal mood and affect  Labs  High Sensitivity Troponin:   Recent Labs  Lab 02/01/22 0716 02/01/22 1708  TROPONINIHS 12 11     Cardiac EnzymesNo results for input(s): "TROPONINI" in the last 168 hours. No results for input(s): "TROPIPOC" in the last 168 hours.  Chemistry Recent Labs  Lab 02/01/22 0716 02/02/22 0210 02/03/22 0759 02/04/22 0358  NA 139  --  136 138  K 3.5  --  3.6 3.9  CL 105  --  102 102  CO2 25  --  26 28  GLUCOSE 126*  --  107* 108*  BUN 7*  --  12 8  CREATININE 1.20 1.02 1.14 1.11  CALCIUM 9.1  --  9.0 9.1  GFRNONAA >60 >60 >60 >60  ANIONGAP 9  --  8 8    Hematology Recent Labs  Lab 02/02/22 0210 02/03/22 0759 02/04/22 0358  WBC 11.8* 11.0* 13.0*  RBC 3.82* 4.73 4.92  HGB 11.7* 14.5 15.1  HCT 35.4* 42.3 44.3  MCV 92.7 89.4 90.0  MCH 30.6 30.7 30.7  MCHC 33.1 34.3 34.1  RDW 12.7 12.1 12.2  PLT 145* 199 213   BNP Recent Labs  Lab 02/01/22 0716  BNP 13.5    DDimer No results for input(s): "DDIMER" in the last  168 hours.   Radiology  CT CORONARY MORPH W/CTA COR W/SCORE W/CA W/CM &/OR WO/CM  Result Date: 02/02/2022 HISTORY: Chest pain, nonspecific EXAM: Cardiac/Coronary  CT TECHNIQUE: The patient was scanned on a [employer]. PROTOCOL: A 120 kV prospective scan was triggered in the descending thoracic aorta at 111 HU's. Axial non-contrast 3 mm slices were carried out through the heart. The data set was analyzed on a dedicated work station and scored using the Agatston method. Gantry rotation speed was 250 msecs and collimation was .6 mm. Beta blockade and 0.8 mg of sl NTG was given. The 3D data set was reconstructed in 5% intervals of the 35-75 % of the R-R cycle. Systolic and diastolic phases were analyzed on a dedicated work station using MPR, MIP and VRT modes. The patient received contrast: 121m OMNIPAQUE IOHEXOL 350 MG/ML SOLN. FINDINGS: Image quality: Good Noise artifact is: Limited Coronary calcium score is 339, which places the patient in the 86th percentile for age and sex matched control. Coronary arteries: Normal coronary origins.  Right dominance. Right Coronary Artery: Minimal mixed atherosclerotic plaque in the proximal RCA, <25% stenosis. PLA patent without significant stenosis. PDA origin visualized but remainder of vessel truncated in z-axis coverage. Left Main Coronary Artery: Mild mixed atherosclerotic plaque in the distal left main coronary artery, 25-49% stenosis. Left Anterior Descending Coronary Artery: Moderate mixed atherosclerotic plaque in the proximal LAD, 50-69% stenosis with positive remodeling and low attenuation plaque which are high risk features. Severe mixed atherosclerotic plaque in the mid LAD, 70-99% stenosis and primarily calcified. Moderate atherosclerotic plaque in the mid LAD, 50-69% stenosis. Left Circumflex Artery: Minimal mixed atherosclerotic plaque in the distal LCx and proximal large caliber OM1, <25% stenosis. Aorta: Normal size, 32 mm at the mid ascending  aorta (level of the PA bifurcation) measured double oblique. No calcifications. No dissection. Aortic Valve: No calcifications. AV calcium score 0 Other findings: Normal pulmonary vein drainage into the left atrium. Normal left atrial appendage without thrombus. Normal size of the pulmonary artery. IMPRESSION: 1. Severe CAD in the mid LAD, 70-99% stenosis, CADRADS 4. 2. Coronary calcium score is 339, which places the patient in the 86th percentile for age and sex matched control. 3. Normal coronary  origins with right dominance. CT FFR will be sent however may not be available due to technical limitations of the scan. RECOMMENDATIONS: CAD-RADS 4 Severe stenosis. (70-99% or > 50% left main). Cardiac catheterization or CT FFR is recommended. Consider symptom-guided anti-ischemic pharmacotherapy as well as risk factor modification per guideline directed care. Electronically Signed   By: Cherlynn Kaiser M.D.   On: 02/02/2022 14:50   ECHOCARDIOGRAM COMPLETE  Result Date: 02/02/2022    ECHOCARDIOGRAM REPORT   Patient Name:   Densel A Zeman Date of Exam: 02/02/2022 Medical Rec #:  [id number]     Height:       69.0 in Accession #:    [id number]    Weight:       239.2 lb Date of Birth:  [birth year]      BSA:          2.229 m Patient Age:    66 years      BP:           138/70 mmHg Patient Gender: M             HR:           54 bpm. Exam Location:  Inpatient Procedure: 2D Echo, Cardiac Doppler and Color Doppler Indications:    Nonischemic Cardiomyopathy I42.8  History:        Patient has no prior history of Echocardiogram examinations.                 CHF; Risk Factors:Hypertension and Dyslipidemia.  Sonographer:    Darlina Sicilian RDCS Referring Phys: [place]  1. Left ventricular ejection fraction, by estimation, is 45 to 50%. The left ventricle has mildly decreased function. The left ventricle demonstrates global hypokinesis. Left ventricular diastolic parameters are consistent with Grade I  diastolic dysfunction (impaired relaxation).  2. Right ventricular systolic function is normal. The right ventricular size is normal. Tricuspid regurgitation signal is inadequate for assessing PA pressure.  3. Left atrial size was mildly dilated.  4. The mitral valve is grossly normal. No evidence of mitral valve regurgitation. No evidence of mitral stenosis.  5. The aortic valve is tricuspid. Aortic valve regurgitation is not visualized. No aortic stenosis is present.  6. Pulmonic valve regurgitation is moderate. Comparison(s): No prior Echocardiogram. FINDINGS  Left Ventricle: Left ventricular ejection fraction, by estimation, is 45 to 50%. The left ventricle has mildly decreased function. The left ventricle demonstrates global hypokinesis. The left ventricular internal cavity size was normal in size. There is  no left ventricular hypertrophy. Left ventricular diastolic parameters are consistent with Grade I diastolic dysfunction (impaired relaxation). Right Ventricle: The right ventricular size is normal. No increase in right ventricular wall thickness. Right ventricular systolic function is normal. Tricuspid regurgitation signal is inadequate for assessing PA pressure. Left Atrium: Left atrial size was mildly dilated. Right Atrium: Right atrial size was normal in size. Pericardium: Trivial pericardial effusion is present. The pericardial effusion is posterior to the left ventricle. Mitral Valve: The mitral valve is grossly normal. No evidence of mitral valve regurgitation. No evidence of mitral valve stenosis. Tricuspid Valve: The tricuspid valve is normal in structure. Tricuspid valve regurgitation is not demonstrated. No evidence of tricuspid stenosis. Aortic Valve: The aortic valve is tricuspid. Aortic valve regurgitation is not visualized. No aortic stenosis is present. Pulmonic Valve: The pulmonic valve was normal in structure. Pulmonic valve regurgitation is moderate. No evidence of pulmonic stenosis.  Aorta: The aortic root and ascending aorta are structurally  normal, with no evidence of dilitation. IAS/Shunts: No atrial level shunt detected by color flow Doppler.  LEFT VENTRICLE PLAX 2D LVIDd:         5.20 cm      Diastology LVIDs:         3.50 cm      LV e' medial:    7.40 cm/s LV PW:         0.90 cm      LV E/e' medial:  9.2 LV IVS:        0.90 cm      LV e' lateral:   9.90 cm/s LVOT diam:     2.20 cm      LV E/e' lateral: 6.9 LV SV:         73 LV SV Index:   33 LVOT Area:     3.80 cm  LV Volumes (MOD) LV vol d, MOD A2C: 135.0 ml LV vol d, MOD A4C: 171.0 ml LV vol s, MOD A2C: 68.4 ml LV vol s, MOD A4C: 81.2 ml LV SV MOD A2C:     66.6 ml LV SV MOD A4C:     171.0 ml LV SV MOD BP:      80.1 ml RIGHT VENTRICLE RV S prime:     13.10 cm/s TAPSE (M-mode): 2.6 cm LEFT ATRIUM             Index        RIGHT ATRIUM           Index LA diam:        3.50 cm 1.57 cm/m   RA Area:     25.20 cm LA Vol (A2C):   69.9 ml 31.36 ml/m  RA Volume:   88.50 ml  39.70 ml/m LA Vol (A4C):   78.9 ml 35.39 ml/m LA Biplane Vol: 75.3 ml 33.78 ml/m  AORTIC VALVE             PULMONIC VALVE LVOT Vmax:   87.80 cm/s  PR End Diast Vel: 2.16 msec LVOT Vmean:  58.300 cm/s LVOT VTI:    0.193 m  AORTA Ao Root diam: 3.00 cm Ao Asc diam:  3.20 cm MITRAL VALVE MV Area (PHT): 3.19 cm    SHUNTS MV Decel Time: 238 msec    Systemic VTI:  0.19 m MV E velocity: 68.40 cm/s  Systemic Diam: 2.20 cm MV A velocity: 59.70 cm/s MV E/A ratio:  1.15 Rudean Haskell MD Electronically signed by Rudean Haskell MD Signature Date/Time: 02/02/2022/2:07:19 PM    Final     Cardiac Studies  TTE 02/02/2022  1. Left ventricular ejection fraction, by estimation, is 45 to 50%. The  left ventricle has mildly decreased function. The left ventricle  demonstrates global hypokinesis. Left ventricular diastolic parameters are  consistent with Grade I diastolic  dysfunction (impaired relaxation).   2. Right ventricular systolic function is normal. The right  ventricular  size is normal. Tricuspid regurgitation signal is inadequate for assessing  PA pressure.   3. Left atrial size was mildly dilated.   4. The mitral valve is grossly normal. No evidence of mitral valve  regurgitation. No evidence of mitral stenosis.   5. The aortic valve is tricuspid. Aortic valve regurgitation is not  visualized. No aortic stenosis is present.   6. Pulmonic valve regurgitation is moderate.   CCTA 02/02/2022 IMPRESSION: 1. Severe CAD in the mid LAD, 70-99% stenosis, CADRADS 4.   2. Coronary calcium score is 339, which places the patient  in the 86th percentile for age and sex matched control.   3. Normal coronary origins with right dominance.   CT FFR will be sent however may not be available due to technical limitations of the scan.   RECOMMENDATIONS: CAD-RADS 4 Severe stenosis. (70-99% or > 50% left main). Cardiac catheterization or CT FFR is recommended. Consider symptom-guided anti-ischemic pharmacotherapy as well as risk factor modification per guideline directed care.  Patient Profile  EARLAND REISH is a 66 y.o. male with nonischemic cardiomyopathy, hypertension who was admitted with chest pain.  Coronary CTA abnormal.  Left heart catheterization today.   Assessment & Plan   #Chest pain, abnormal CCTA -Severe stenosis on coronary CTA.  Left heart catheterization today. -Continue aspirin and statin therapy. -continue BB  Shared Decision Making/Informed Consent The risks [stroke (1 in 1000), death (1 in 1000), kidney failure [usually temporary] (1 in 500), bleeding (1 in 200), allergic reaction [possibly serious] (1 in 200)], benefits (diagnostic support and management of coronary artery disease) and alternatives of a cardiac catheterization were discussed in detail with Mr. Neuharth and he is willing to proceed.  #Systolic HF, EF 41-66% -Euvolemic on exam -Continue carvedilol 3.125 mg twice daily, Aldactone 25 mg daily.  Increase lisinopril to 40  mg daily. -Add Farxiga 10 mg daily. -Euvolemic on exam.  #HTN -home meds  For questions or updates, please contact [place] Please consult [email or web address] for contact info under   Signed, [place] Bells T. Audie Box, MD, [place]  02/04/2022 7:17 AM

## 2022-02-04 NOTE — Interval H&P Note (Signed)
History and Physical Interval Note:  02/04/2022 4:54 PM  [name]  has presented today for surgery, with the diagnosis of unstable angina.  The various methods of treatment have been discussed with the patient and family. After consideration of risks, benefits and other options for treatment, the patient has consented to  Procedure(s): LEFT HEART CATH AND CORONARY ANGIOGRAPHY (N/A) as a surgical intervention.  The patient's history has been reviewed, patient examined, no change in status, stable for surgery.  I have reviewed the patient's chart and labs.  Questions were answered to the patient's satisfaction.     Sherren Mocha

## 2022-02-04 NOTE — Progress Notes (Signed)
Cardiology Progress Note  Patient ID: [name] MRN: [id number] DOB: 1956-02-07 Date of Encounter: 02/04/2022  Primary Cardiologist: Glenetta Hew, MD  Subjective   Chief Complaint: None.  HPI: None. LHC today.   ROS:  All other ROS reviewed and negative. Pertinent positives noted in the HPI.     Inpatient Medications  Scheduled Meds:  aspirin EC  81 mg Oral Daily   atorvastatin  80 mg Oral Daily   carvedilol  3.125 mg Oral BID WC   dapagliflozin propanediol  10 mg Oral Daily   DULoxetine  60 mg Oral Daily   heparin  5,000 Units Subcutaneous Q8H   lidocaine  1 patch Transdermal Q24H   lisinopril  40 mg Oral Daily   sodium chloride flush  3 mL Intravenous Q12H   spironolactone  25 mg Oral Daily   Continuous Infusions:  sodium chloride     sodium chloride 1 mL/kg/hr (02/04/22 0547)   PRN Meds: sodium chloride, acetaminophen, melatonin, nitroGLYCERIN, ondansetron (ZOFRAN) IV, sodium chloride flush, traMADol   Vital Signs   Vitals:   02/03/22 1014 02/03/22 1243 02/03/22 2029 02/04/22 0431  BP: 132/73 138/80 138/82 (!) 154/95  Pulse:  63 62 60  Resp: '18 18 18   '$ Temp: (!) 97.5 F (36.4 C) 98.6 F (37 C) 98.3 F (36.8 C) 98.3 F (36.8 C)  TempSrc: Oral Oral Oral Oral  SpO2:   98% 99%  Weight:    108.5 kg  Height:       No intake or output data in the 24 hours ending 02/04/22 0717    02/04/2022    4:31 AM 02/02/2022    2:38 AM 06/19/2020   10:19 AM  Last 3 Weights  Weight (lbs) 239 lb 4.8 oz 239 lb 3.2 oz 234 lb  Weight (kg) 108.546 kg 108.5 kg 106.142 kg      Telemetry  Overnight telemetry shows SR 60s, which I personally reviewed.   ECG  The most recent ECG shows SR 81 bpm, LVH, which I personally reviewed.   Physical Exam   Vitals:   02/03/22 1014 02/03/22 1243 02/03/22 2029 02/04/22 0431  BP: 132/73 138/80 138/82 (!) 154/95  Pulse:  63 62 60  Resp: '18 18 18   '$ Temp: (!) 97.5 F (36.4 C) 98.6 F (37 C) 98.3 F (36.8 C) 98.3 F (36.8 C)   TempSrc: Oral Oral Oral Oral  SpO2:   98% 99%  Weight:    108.5 kg  Height:       No intake or output data in the 24 hours ending 02/04/22 0717     02/04/2022    4:31 AM 02/02/2022    2:38 AM 06/19/2020   10:19 AM  Last 3 Weights  Weight (lbs) 239 lb 4.8 oz 239 lb 3.2 oz 234 lb  Weight (kg) 108.546 kg 108.5 kg 106.142 kg    Body mass index is 35.34 kg/m.  General: Well nourished, well developed, in no acute distress Head: Atraumatic, normal size  Eyes: PEERLA, EOMI  Neck: Supple, no JVD Endocrine: No thryomegaly Cardiac: Normal S1, S2; RRR; no murmurs, rubs, or gallops Lungs: Clear to auscultation bilaterally, no wheezing, rhonchi or rales  Abd: Soft, nontender, no hepatomegaly  Ext: No edema, pulses 2+ Musculoskeletal: No deformities, BUE and BLE strength normal and equal Skin: Warm and dry, no rashes   Neuro: Alert and oriented to person, place, time, and situation, CNII-XII grossly intact, no focal deficits  Psych: Normal mood and affect  Labs  High Sensitivity Troponin:   Recent Labs  Lab 02/01/22 0716 02/01/22 1708  TROPONINIHS 12 11     Cardiac EnzymesNo results for input(s): "TROPONINI" in the last 168 hours. No results for input(s): "TROPIPOC" in the last 168 hours.  Chemistry Recent Labs  Lab 02/01/22 0716 02/02/22 0210 02/03/22 0759 02/04/22 0358  NA 139  --  136 138  K 3.5  --  3.6 3.9  CL 105  --  102 102  CO2 25  --  26 28  GLUCOSE 126*  --  107* 108*  BUN 7*  --  12 8  CREATININE 1.20 1.02 1.14 1.11  CALCIUM 9.1  --  9.0 9.1  GFRNONAA >60 >60 >60 >60  ANIONGAP 9  --  8 8    Hematology Recent Labs  Lab 02/02/22 0210 02/03/22 0759 02/04/22 0358  WBC 11.8* 11.0* 13.0*  RBC 3.82* 4.73 4.92  HGB 11.7* 14.5 15.1  HCT 35.4* 42.3 44.3  MCV 92.7 89.4 90.0  MCH 30.6 30.7 30.7  MCHC 33.1 34.3 34.1  RDW 12.7 12.1 12.2  PLT 145* 199 213   BNP Recent Labs  Lab 02/01/22 0716  BNP 13.5    DDimer No results for input(s): "DDIMER" in the last  168 hours.   Radiology  CT CORONARY MORPH W/CTA COR W/SCORE W/CA W/CM &/OR WO/CM  Result Date: 02/02/2022 HISTORY: Chest pain, nonspecific EXAM: Cardiac/Coronary  CT TECHNIQUE: The patient was scanned on a [employer]. PROTOCOL: A 120 kV prospective scan was triggered in the descending thoracic aorta at 111 HU's. Axial non-contrast 3 mm slices were carried out through the heart. The data set was analyzed on a dedicated work station and scored using the Agatston method. Gantry rotation speed was 250 msecs and collimation was .6 mm. Beta blockade and 0.8 mg of sl NTG was given. The 3D data set was reconstructed in 5% intervals of the 35-75 % of the R-R cycle. Systolic and diastolic phases were analyzed on a dedicated work station using MPR, MIP and VRT modes. The patient received contrast: 146m OMNIPAQUE IOHEXOL 350 MG/ML SOLN. FINDINGS: Image quality: Good Noise artifact is: Limited Coronary calcium score is 339, which places the patient in the 86th percentile for age and sex matched control. Coronary arteries: Normal coronary origins.  Right dominance. Right Coronary Artery: Minimal mixed atherosclerotic plaque in the proximal RCA, <25% stenosis. PLA patent without significant stenosis. PDA origin visualized but remainder of vessel truncated in z-axis coverage. Left Main Coronary Artery: Mild mixed atherosclerotic plaque in the distal left main coronary artery, 25-49% stenosis. Left Anterior Descending Coronary Artery: Moderate mixed atherosclerotic plaque in the proximal LAD, 50-69% stenosis with positive remodeling and low attenuation plaque which are high risk features. Severe mixed atherosclerotic plaque in the mid LAD, 70-99% stenosis and primarily calcified. Moderate atherosclerotic plaque in the mid LAD, 50-69% stenosis. Left Circumflex Artery: Minimal mixed atherosclerotic plaque in the distal LCx and proximal large caliber OM1, <25% stenosis. Aorta: Normal size, 32 mm at the mid ascending  aorta (level of the PA bifurcation) measured double oblique. No calcifications. No dissection. Aortic Valve: No calcifications. AV calcium score 0 Other findings: Normal pulmonary vein drainage into the left atrium. Normal left atrial appendage without thrombus. Normal size of the pulmonary artery. IMPRESSION: 1. Severe CAD in the mid LAD, 70-99% stenosis, CADRADS 4. 2. Coronary calcium score is 339, which places the patient in the 86th percentile for age and sex matched control. 3. Normal coronary  origins with right dominance. CT FFR will be sent however may not be available due to technical limitations of the scan. RECOMMENDATIONS: CAD-RADS 4 Severe stenosis. (70-99% or > 50% left main). Cardiac catheterization or CT FFR is recommended. Consider symptom-guided anti-ischemic pharmacotherapy as well as risk factor modification per guideline directed care. Electronically Signed   By: Cherlynn Kaiser M.D.   On: 02/02/2022 14:50   ECHOCARDIOGRAM COMPLETE  Result Date: 02/02/2022    ECHOCARDIOGRAM REPORT   Patient Name:   [name] Date of Exam: 02/02/2022 Medical Rec #:  [id number]     Height:       69.0 in Accession #:    [id number]    Weight:       239.2 lb Date of Birth:  [birth year]      BSA:          2.229 m Patient Age:    28 years      BP:           138/70 mmHg Patient Gender: M             HR:           54 bpm. Exam Location:  Inpatient Procedure: 2D Echo, Cardiac Doppler and Color Doppler Indications:    Nonischemic Cardiomyopathy I42.8  History:        Patient has no prior history of Echocardiogram examinations.                 CHF; Risk Factors:Hypertension and Dyslipidemia.  Sonographer:    Darlina Sicilian RDCS Referring Phys: Leon  1. Left ventricular ejection fraction, by estimation, is 45 to 50%. The left ventricle has mildly decreased function. The left ventricle demonstrates global hypokinesis. Left ventricular diastolic parameters are consistent with Grade I  diastolic dysfunction (impaired relaxation).  2. Right ventricular systolic function is normal. The right ventricular size is normal. Tricuspid regurgitation signal is inadequate for assessing PA pressure.  3. Left atrial size was mildly dilated.  4. The mitral valve is grossly normal. No evidence of mitral valve regurgitation. No evidence of mitral stenosis.  5. The aortic valve is tricuspid. Aortic valve regurgitation is not visualized. No aortic stenosis is present.  6. Pulmonic valve regurgitation is moderate. Comparison(s): No prior Echocardiogram. FINDINGS  Left Ventricle: Left ventricular ejection fraction, by estimation, is 45 to 50%. The left ventricle has mildly decreased function. The left ventricle demonstrates global hypokinesis. The left ventricular internal cavity size was normal in size. There is  no left ventricular hypertrophy. Left ventricular diastolic parameters are consistent with Grade I diastolic dysfunction (impaired relaxation). Right Ventricle: The right ventricular size is normal. No increase in right ventricular wall thickness. Right ventricular systolic function is normal. Tricuspid regurgitation signal is inadequate for assessing PA pressure. Left Atrium: Left atrial size was mildly dilated. Right Atrium: Right atrial size was normal in size. Pericardium: Trivial pericardial effusion is present. The pericardial effusion is posterior to the left ventricle. Mitral Valve: The mitral valve is grossly normal. No evidence of mitral valve regurgitation. No evidence of mitral valve stenosis. Tricuspid Valve: The tricuspid valve is normal in structure. Tricuspid valve regurgitation is not demonstrated. No evidence of tricuspid stenosis. Aortic Valve: The aortic valve is tricuspid. Aortic valve regurgitation is not visualized. No aortic stenosis is present. Pulmonic Valve: The pulmonic valve was normal in structure. Pulmonic valve regurgitation is moderate. No evidence of pulmonic stenosis.  Aorta: The aortic root and ascending aorta are structurally  normal, with no evidence of dilitation. IAS/Shunts: No atrial level shunt detected by color flow Doppler.  LEFT VENTRICLE PLAX 2D LVIDd:         5.20 cm      Diastology LVIDs:         3.50 cm      LV e' medial:    7.40 cm/s LV PW:         0.90 cm      LV E/e' medial:  9.2 LV IVS:        0.90 cm      LV e' lateral:   9.90 cm/s LVOT diam:     2.20 cm      LV E/e' lateral: 6.9 LV SV:         73 LV SV Index:   33 LVOT Area:     3.80 cm  LV Volumes (MOD) LV vol d, MOD A2C: 135.0 ml LV vol d, MOD A4C: 171.0 ml LV vol s, MOD A2C: 68.4 ml LV vol s, MOD A4C: 81.2 ml LV SV MOD A2C:     66.6 ml LV SV MOD A4C:     171.0 ml LV SV MOD BP:      80.1 ml RIGHT VENTRICLE RV S prime:     13.10 cm/s TAPSE (M-mode): 2.6 cm LEFT ATRIUM             Index        RIGHT ATRIUM           Index LA diam:        3.50 cm 1.57 cm/m   RA Area:     25.20 cm LA Vol (A2C):   69.9 ml 31.36 ml/m  RA Volume:   88.50 ml  39.70 ml/m LA Vol (A4C):   78.9 ml 35.39 ml/m LA Biplane Vol: 75.3 ml 33.78 ml/m  AORTIC VALVE             PULMONIC VALVE LVOT Vmax:   87.80 cm/s  PR End Diast Vel: 2.16 msec LVOT Vmean:  58.300 cm/s LVOT VTI:    0.193 m  AORTA Ao Root diam: 3.00 cm Ao Asc diam:  3.20 cm MITRAL VALVE MV Area (PHT): 3.19 cm    SHUNTS MV Decel Time: 238 msec    Systemic VTI:  0.19 m MV E velocity: 68.40 cm/s  Systemic Diam: 2.20 cm MV A velocity: 59.70 cm/s MV E/A ratio:  1.15 Rudean Haskell MD Electronically signed by Rudean Haskell MD Signature Date/Time: 02/02/2022/2:07:19 PM    Final     Cardiac Studies  TTE 02/02/2022  1. Left ventricular ejection fraction, by estimation, is 45 to 50%. The  left ventricle has mildly decreased function. The left ventricle  demonstrates global hypokinesis. Left ventricular diastolic parameters are  consistent with Grade I diastolic  dysfunction (impaired relaxation).   2. Right ventricular systolic function is normal. The right  ventricular  size is normal. Tricuspid regurgitation signal is inadequate for assessing  PA pressure.   3. Left atrial size was mildly dilated.   4. The mitral valve is grossly normal. No evidence of mitral valve  regurgitation. No evidence of mitral stenosis.   5. The aortic valve is tricuspid. Aortic valve regurgitation is not  visualized. No aortic stenosis is present.   6. Pulmonic valve regurgitation is moderate.   CCTA 02/02/2022 IMPRESSION: 1. Severe CAD in the mid LAD, 70-99% stenosis, CADRADS 4.   2. Coronary calcium score is 339, which places the patient  in the 86th percentile for age and sex matched control.   3. Normal coronary origins with right dominance.   CT FFR will be sent however may not be available due to technical limitations of the scan.   RECOMMENDATIONS: CAD-RADS 4 Severe stenosis. (70-99% or > 50% left main). Cardiac catheterization or CT FFR is recommended. Consider symptom-guided anti-ischemic pharmacotherapy as well as risk factor modification per guideline directed care.  Patient Profile  [name] is a 66 y.o. male with nonischemic cardiomyopathy, hypertension who was admitted with chest pain.  Coronary CTA abnormal.  Left heart catheterization today.   Assessment & Plan   #Chest pain, abnormal CCTA -Severe stenosis on coronary CTA.  Left heart catheterization today. -Continue aspirin and statin therapy. -continue BB  Shared Decision Making/Informed Consent The risks [stroke (1 in 1000), death (1 in 1000), kidney failure [usually temporary] (1 in 500), bleeding (1 in 200), allergic reaction [possibly serious] (1 in 200)], benefits (diagnostic support and management of coronary artery disease) and alternatives of a cardiac catheterization were discussed in detail with [name] and he is willing to proceed.  #Systolic HF, EF 32-67% -Euvolemic on exam -Continue carvedilol 3.125 mg twice daily, Aldactone 25 mg daily.  Increase lisinopril to 40  mg daily. -Add Farxiga 10 mg daily. -Euvolemic on exam.  #HTN -home meds  For questions or updates, please contact [place] Please consult [email or web address] for contact info under   Signed, [place] Bells T. Audie Box, MD, [place]  02/04/2022 7:17 AM

## 2022-02-05 ENCOUNTER — Encounter (HOSPITAL_COMMUNITY): Payer: Self-pay | Admitting: Cardiovascular Disease

## 2022-02-05 DIAGNOSIS — R072 Precordial pain: Secondary | ICD-10-CM | POA: Diagnosis not present

## 2022-02-05 LAB — BASIC METABOLIC PANEL
Anion gap: 7 (ref 5–15)
BUN: 10 mg/dL (ref 8–23)
CO2: 26 mmol/L (ref 22–32)
Calcium: 8.8 mg/dL — ABNORMAL LOW (ref 8.9–10.3)
Chloride: 105 mmol/L (ref 98–111)
Creatinine, Ser: 1.24 mg/dL (ref 0.61–1.24)
GFR, Estimated: >60 mL/min (ref >60)
Glucose, Bld: 106 mg/dL — ABNORMAL HIGH (ref 70–99)
Potassium: 4.2 mmol/L (ref 3.5–5.1)
Sodium: 138 mmol/L (ref 135–145)

## 2022-02-05 LAB — CBC
HCT: 40.6 % (ref 39.0–52.0)
Hemoglobin: 13.3 g/dL (ref 13.0–17.0)
MCH: 30 pg (ref 26.0–34.0)
MCHC: 32.8 g/dL (ref 30.0–36.0)
MCV: 91.6 fL (ref 80.0–100.0)
Platelets: 192 10*3/uL (ref 150–400)
RBC: 4.43 MIL/uL (ref 4.22–5.81)
RDW: 12.4 % (ref 11.5–15.5)
WBC: 10.2 10*3/uL (ref 4.0–10.5)
nRBC: 0 % (ref 0.0–0.2)

## 2022-02-05 LAB — MAGNESIUM: Magnesium: 1.9 mg/dL (ref 1.7–2.4)

## 2022-02-05 MED ORDER — CARVEDILOL 12.5 MG PO TABS
12.5000 mg | ORAL_TABLET | Freq: Two times a day (BID) | ORAL | Status: DC
  Administered 2022-02-05: 12.5 mg via ORAL
  Filled 2022-02-05: qty 1

## 2022-02-05 MED ORDER — PANTOPRAZOLE SODIUM 40 MG PO TBEC: 40.0000 mg | DELAYED_RELEASE_TABLET | Freq: Every day | ORAL | 3 refills | Status: AC

## 2022-02-05 MED ORDER — LISINOPRIL 40 MG PO TABS: 40.0000 mg | ORAL_TABLET | Freq: Every day | ORAL | 3 refills | Status: AC

## 2022-02-05 MED ORDER — PANTOPRAZOLE SODIUM 40 MG PO TBEC
40.0000 mg | DELAYED_RELEASE_TABLET | Freq: Every day | ORAL | Status: DC
  Administered 2022-02-05: 40 mg via ORAL
  Filled 2022-02-05: qty 1

## 2022-02-05 MED ORDER — FUROSEMIDE 20 MG PO TABS: 20.0000 mg | ORAL_TABLET | Freq: Every day | ORAL | 3 refills | Status: AC

## 2022-02-05 MED ORDER — EMPAGLIFLOZIN 10 MG PO TABS: 10.0000 mg | ORAL_TABLET | Freq: Every day | ORAL | 3 refills | Status: AC

## 2022-02-05 MED ORDER — FUROSEMIDE 20 MG PO TABS
20.0000 mg | ORAL_TABLET | Freq: Every day | ORAL | Status: DC
  Administered 2022-02-05: 20 mg via ORAL
  Filled 2022-02-05: qty 1

## 2022-02-05 NOTE — Progress Notes (Signed)
Left heart catheterization with nonobstructive disease.  He will be discharged.  Suspect his chest pain could have been acid reflux related.  Protonix was ordered.  Full discharge summary to follow.  [place] Bells T. Audie Box, MD, [facility]  [address], [place], [place] [address] [phone]  8:00 AM

## 2022-02-05 NOTE — Plan of Care (Signed)
  Problem: Education: Goal: Understanding of cardiac disease, CV risk reduction, and recovery process will improve Outcome: Adequate for Discharge Goal: Individualized Educational Video(s) Outcome: Adequate for Discharge   Problem: Activity: Goal: Ability to tolerate increased activity will improve Outcome: Adequate for Discharge   Problem: Cardiac: Goal: Ability to achieve and maintain adequate cardiovascular perfusion will improve Outcome: Adequate for Discharge   Problem: Health Behavior/Discharge Planning: Goal: Ability to manage health-related needs will improve Outcome: Adequate for Discharge   Problem: Nutrition: Goal: Adequate nutrition will be maintained Outcome: Adequate for Discharge   Problem: Skin Integrity: Goal: Risk for impaired skin integrity will decrease Outcome: Adequate for Discharge

## 2022-02-14 NOTE — Progress Notes (Deleted)
Cardiology Clinic Note   Patient Name: [name] Date of Encounter: 02/14/2022  Primary Care Provider:  Default, Provider, MD Primary Cardiologist:  Glenetta Hew, MD  Patient Profile    [name] 66 year old male presents to the clinic today for follow-up evaluation of his nonischemic cardiomyopathy and hypertension.  Past Medical History    Past Medical History:  Diagnosis Date   Chronic HFrEF (heart failure with reduced ejection fraction) ([place])    a. 09/2008 Echo: EF 30-40%, mod diff HK. Mildly dil LA.   Depression    Hypertension    Leukemia ([place])    Mixed hyperlipidemia    NICM (nonischemic cardiomyopathy) ([place])    A. 09/2008 Echo: EF 30-40%; b. 09/2008 Cath: Nl cors, EF 40%; c. ~ 2018 reportedly nl stress test.   Thyroid nodule    Past Surgical History:  Procedure Laterality Date   BACK SURGERY     LEFT HEART CATH AND CORONARY ANGIOGRAPHY N/A 02/04/2022   Procedure: LEFT HEART CATH AND CORONARY ANGIOGRAPHY;  Surgeon: Sherren Mocha, MD;  Location: [facility];  Service: Cardiovascular;  Laterality: N/A;    Allergies  Allergies  Allergen Reactions   Penicillins Palpitations    History of Present Illness    [name] has a PMH of hypertension, nonischemic cardiomyopathy, coronary artery disease, precordial chest pain, hyperlipidemia, leukemia and thyroid nodule.  He was admitted to the hospital on 02/02/2022 and discharged on 02/05/2022.  He was admitted with chest discomfort.  His EKG showed no ischemia.  His troponins were negative.  His echocardiogram showed global hypokinesis which was known.  His coronary CTA was obtained and there were concerns for severe stenosis in the proximal LAD.  He underwent cardiac catheterization which showed no evidence of severe disease.  He was noted to have mild disease in his LAD.  His symptoms were felt to be related to acid reflux.  He was started on pantoprazole at discharge.  He presents to the clinic today  for follow-up evaluation and states***  *** denies chest pain, shortness of breath, lower extremity edema, fatigue, palpitations, melena, hematuria, hemoptysis, diaphoresis, weakness, presyncope, syncope, orthopnea, and PND.  Chest discomfort-denies further episodes of chest discomfort since being discharged from the hospital.  He underwent cardiac catheterization which showed mild disease in his LAD.  He was started on Protonix at discharge.  Cath site healing well with no signs of infection. Continue Protonix GERD diet Increase physical activity as tolerated  Systolic CHF-echocardiogram showed an LVEF of 45-50%.  Reports compliance with beta-blocker and Aldactone therapy.  Tolerating increase lisinopril well.  Tolerating Jardiance. Continue lisinopril, Jardiance, carvedilol, furosemide Heart healthy low-sodium diet-salty 6 given Increase physical activity as tolerated Plan for repeat echocardiogram in 3 months. Order BMP  Disposition: Follow-up with Dr. Ellyn Hack or me after echocardiogram.  Home Medications    Prior to Admission medications   Medication Sig Start Date End Date Taking? Authorizing Provider  acetaminophen (TYLENOL) 500 MG tablet Take 1,000 mg by mouth every 4 (four) hours as needed for mild pain.    [provider]  aspirin EC 81 MG tablet Take 81 mg by mouth daily.    [provider]  atorvastatin (LIPITOR) 80 MG tablet Take 80 mg by mouth daily.    [provider]  carvedilol (COREG) 25 MG tablet Take 12.5 mg by mouth 2 (two) times daily.    [provider]  cetirizine (ZYRTEC) 10 MG tablet Take 10 mg by  mouth daily.    [provider]  cholecalciferol (VITAMIN D3) 10 MCG (400 UNIT) TABS tablet Take 400 Units by mouth daily.    [provider]  docusate sodium (COLACE) 100 MG capsule Take 100 mg by mouth 2 (two) times daily.    [provider]  DULoxetine (CYMBALTA) 60 MG capsule Take 60 mg by mouth daily.     [provider]  empagliflozin (JARDIANCE) 10 MG TABS tablet Take 1 tablet (10 mg total) by mouth daily. 02/05/22   O'NealCassie Freer, MD  furosemide (LASIX) 20 MG tablet Take 1 tablet (20 mg total) by mouth daily. 02/05/22   O'NealCassie Freer, MD  lisinopril (ZESTRIL) 40 MG tablet Take 1 tablet (40 mg total) by mouth daily. 02/05/22   Geralynn Rile, MD  pantoprazole (PROTONIX) 40 MG tablet Take 1 tablet (40 mg total) by mouth daily. 02/05/22   O'NealCassie Freer, MD  sildenafil (VIAGRA) 100 MG tablet Take 50 mg by mouth daily as needed for erectile dysfunction.    [provider]  spironolactone (ALDACTONE) 25 MG tablet Take 25 mg by mouth daily.    [provider]  traMADol (ULTRAM) 50 MG tablet Take 50 mg by mouth every 6 (six) hours as needed for moderate pain.    [provider]    Family History    No family history on file. He indicated that his mother is deceased. He indicated that his father is deceased.  Social History    Social History   Socioeconomic History   Marital status: Married    Spouse name: Not on file   Number of children: Not on file   Years of education: Not on file   Highest education level: Not on file  Occupational History   Not on file  Tobacco Use   Smoking status: Former   Smokeless tobacco: Never   Tobacco comments:    Quit 40 yrs ago  Substance and Sexual Activity   Alcohol use: Not Currently    Comment: none x 40 yrs   Drug use: Not Currently    Comment: none x 40 yrs   Sexual activity: Not on file  Other Topics Concern   Not on file  Social History Narrative   [place]ives locally.  Exercises ~ 3x/wk.   Social Determinants of Health   Financial Resource Strain: Not on file  Food Insecurity: Not on file  Transportation Needs: Not on file  Physical Activity: Not on file  Stress: Not on file  Social Connections: Not on file  Intimate Partner Violence: Not on file     Review of Systems     General:  No chills, fever, night sweats or weight changes.  Cardiovascular:  No chest pain, dyspnea on exertion, edema, orthopnea, palpitations, paroxysmal nocturnal dyspnea. Dermatological: No rash, lesions/masses Respiratory: No cough, dyspnea Urologic: No hematuria, dysuria Abdominal:   No nausea, vomiting, diarrhea, bright red blood per rectum, melena, or hematemesis Neurologic:  No visual changes, wkns, changes in mental status. All other systems reviewed and are otherwise negative except as noted above.  Physical Exam    VS:  There were no vitals taken for this visit. , BMI There is no height or weight on file to calculate BMI. GEN: Well nourished, well developed, in no acute distress. HEENT: normal. Neck: Supple, no JVD, carotid bruits, or masses. Cardiac: RRR, no murmurs, rubs, or gallops. No clubbing, cyanosis, edema.  Radials/DP/PT 2+ and equal bilaterally.  Respiratory:  Respirations  regular and unlabored, clear to auscultation bilaterally. GI: Soft, nontender, nondistended, BS + x 4. MS: no deformity or atrophy. Skin: warm and dry, no rash. Neuro:  Strength and sensation are intact. Psych: Normal affect.  Accessory Clinical Findings    Recent Labs: 02/01/2022: B Natriuretic Peptide 13.5 02/02/2022: TSH 0.650 02/05/2022: BUN 10; Creatinine, Ser 1.24; Hemoglobin 13.3; Magnesium 1.9; Platelets 192; Potassium 4.2; Sodium 138   Recent Lipid Panel    Component Value Date/Time   CHOL 77 02/02/2022 0210   TRIG 41 02/02/2022 0210   HDL 28 (L) 02/02/2022 0210   CHOLHDL 2.8 02/02/2022 0210   VLDL 8 02/02/2022 0210   LDLCALC 41 02/02/2022 0210    ECG personally reviewed by me today- *** - No acute changes  Echocardiogram 02/02/2022  IMPRESSIONS     1. Left ventricular ejection fraction, by estimation, is 45 to 50%. The  left ventricle has mildly decreased function. The left ventricle  demonstrates global hypokinesis. Left ventricular diastolic parameters are   consistent with Grade I diastolic  dysfunction (impaired relaxation).   2. Right ventricular systolic function is normal. The right ventricular  size is normal. Tricuspid regurgitation signal is inadequate for assessing  PA pressure.   3. Left atrial size was mildly dilated.   4. The mitral valve is grossly normal. No evidence of mitral valve  regurgitation. No evidence of mitral stenosis.   5. The aortic valve is tricuspid. Aortic valve regurgitation is not  visualized. No aortic stenosis is present.   6. Pulmonic valve regurgitation is moderate.   Comparison(s): No prior Echocardiogram.  Cardiac catheterization 02/04/2022  Patent left main with mild irregularity 2.  Patent LAD with mild diffuse proximal to mid LAD stenosis estimated at 30% 3.  Patent left circumflex with mild nonobstructive plaquing 4.  Dominant RCA with mild irregularity and no significant stenosis 5.  Normal LVEDP  Recommendations: Medical therapy for nonobstructive CAD  Diagnostic Dominance: Right  Intervention   Assessment & Plan   1.  ***   Jossie Ng. Morenike Cuff NP-C     02/14/2022, 11:33 AM [address] Office [phone] Fax [phone]  Notice: This dictation was prepared with Dragon dictation along with smaller phrase technology. Any transcriptional errors that result from this process are unintentional and may not be corrected upon review.  I spent***minutes examining this patient, reviewing medications, and using patient centered shared decision making involving her cardiac care.  Prior to her visit I spent greater than 20 minutes reviewing her past medical history,  medications, and prior cardiac tests.

## 2022-02-15 ENCOUNTER — Ambulatory Visit: Payer: No Typology Code available for payment source | Admitting: General Practice

## 2022-02-25 ENCOUNTER — Encounter: Payer: Self-pay | Admitting: General Practice

## 2023-03-03 IMAGING — MR MR LUMBAR SPINE W/O CM
4 of 5 series · 18 of 48 positions shown · non-contrast
Comparison: Lumbar radiographs 06/19/2020.

CLINICAL DATA: Low back pain with sciatica

EXAM:
MRI LUMBAR SPINE WITHOUT CONTRAST
TECHNIQUE: Multiplanar, multisequence MR imaging of the lumbar spine was
performed. No intravenous contrast was administered.

[Series 3: T2 · sagittal · 4.0mm · 0.55mm/px · 6 of 12 slices shown (1 of 2)]
[im 1/12]
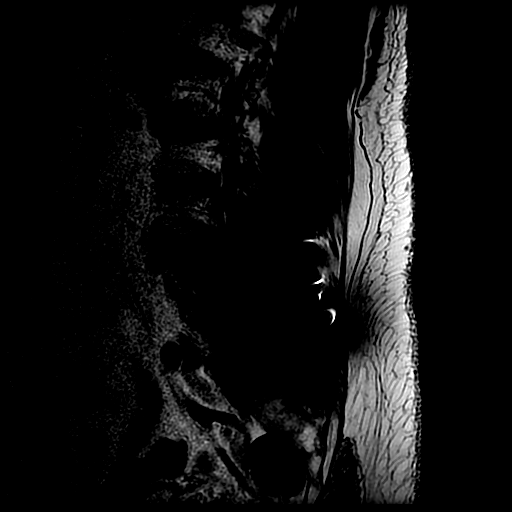
[im 3/12]
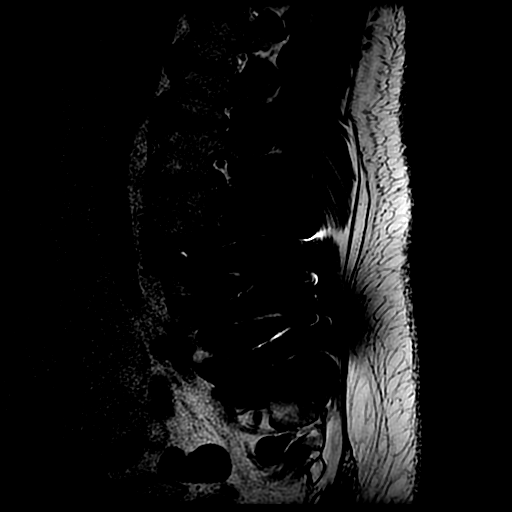
[im 5/12]
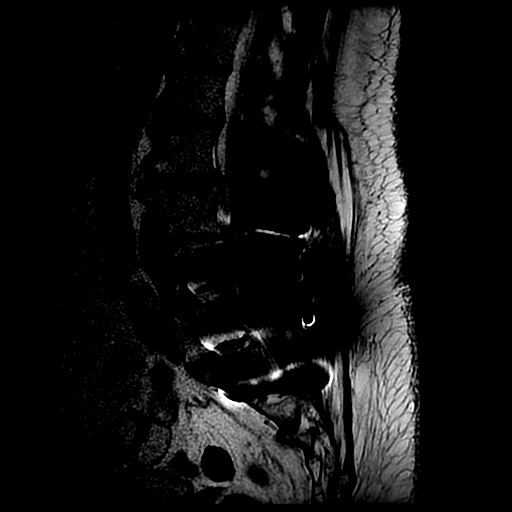
[im 7/12]
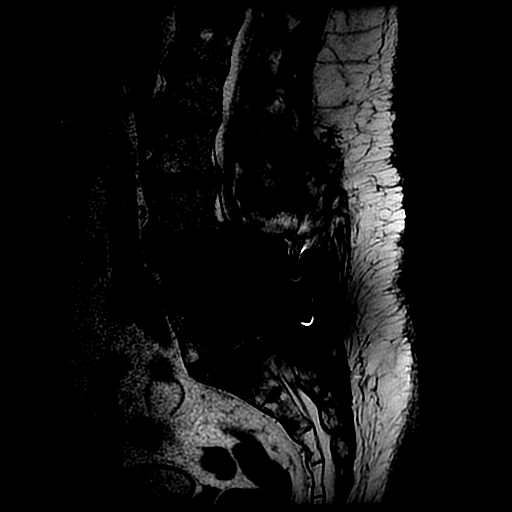
[im 9/12]
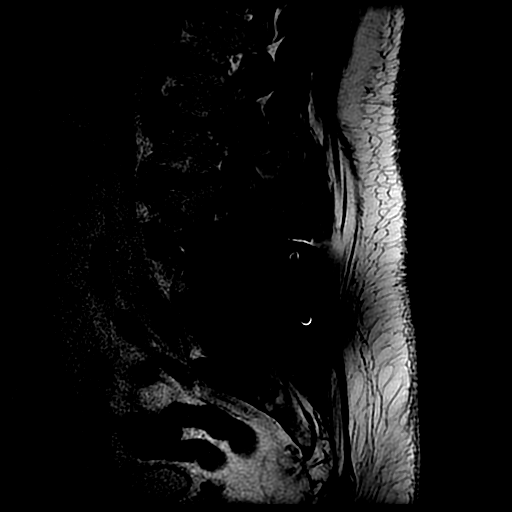
[im 12/12]
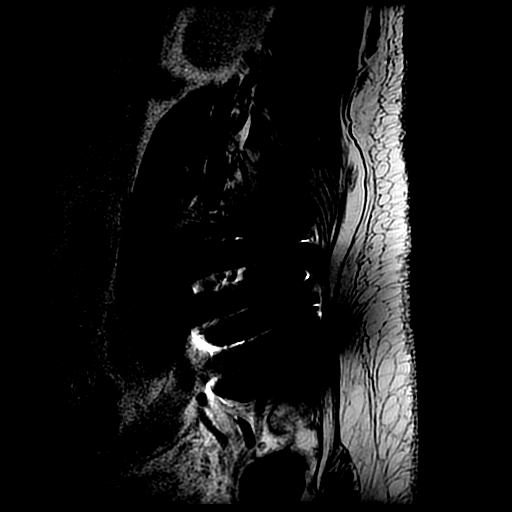

[Series 4: T1 · sagittal · 4.0mm · 0.55mm/px · 3 of 12 slices shown (1 of 2)]
[im 1/12]
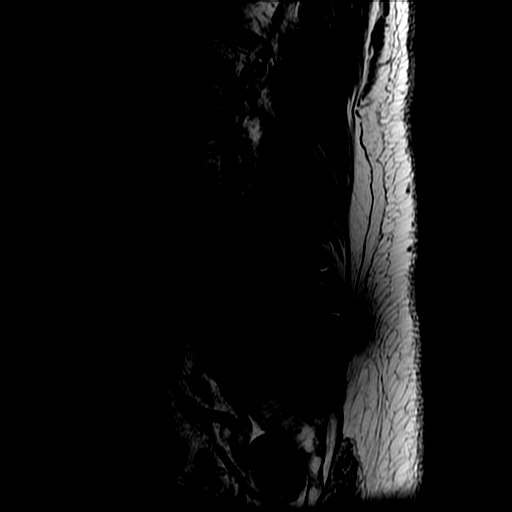
[im 6/12]
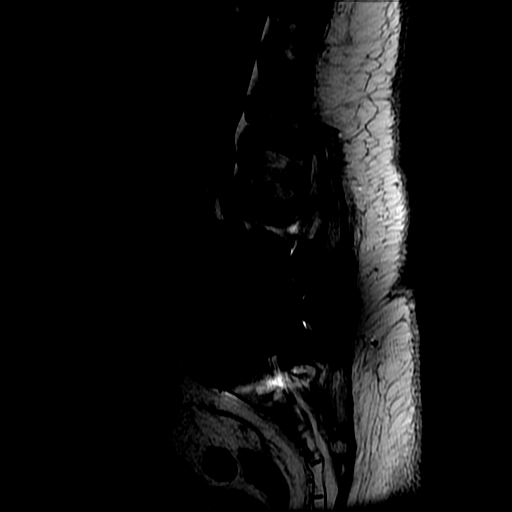
[im 12/12]
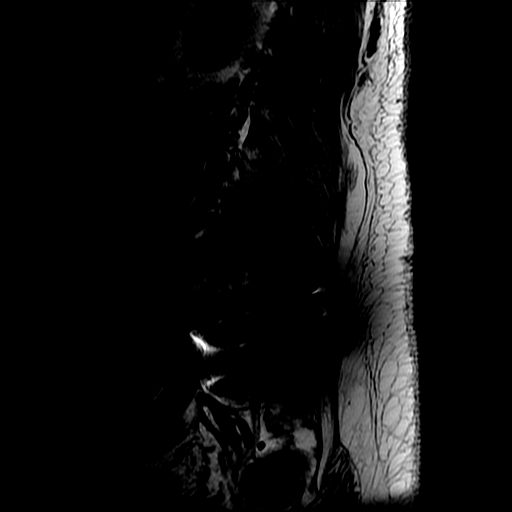

[Series 6: T2 · axial · 4.0mm · 0.39mm/px · z∈[-36,+120]mm · 6 of 35 slices shown (2 of 2)]
[im 3/35]
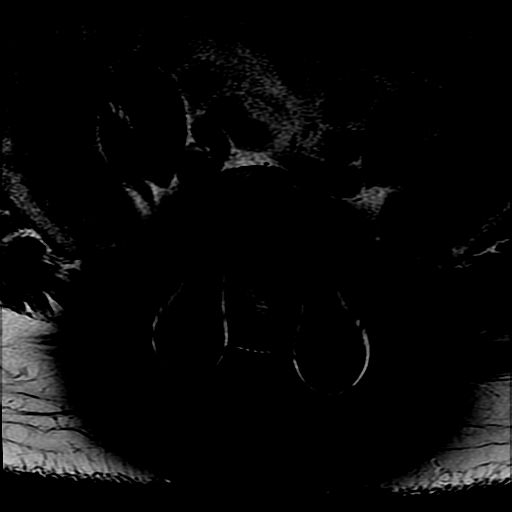
[im 5/35]
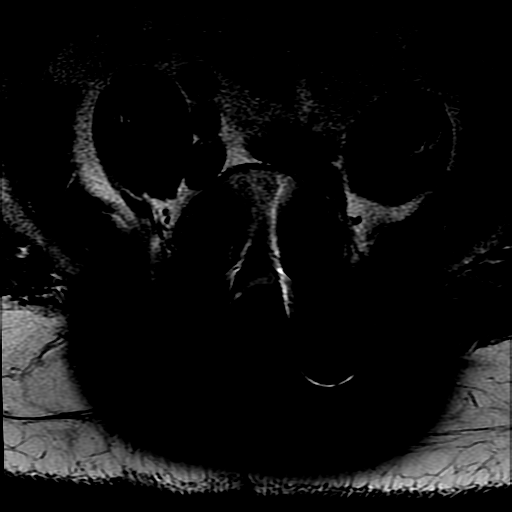
[im 7/35]
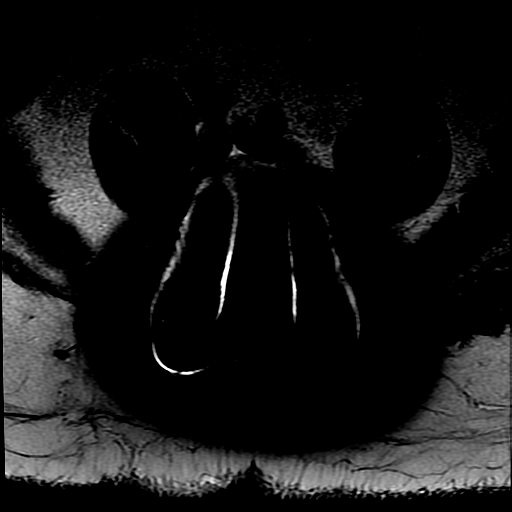
[im 12/35]
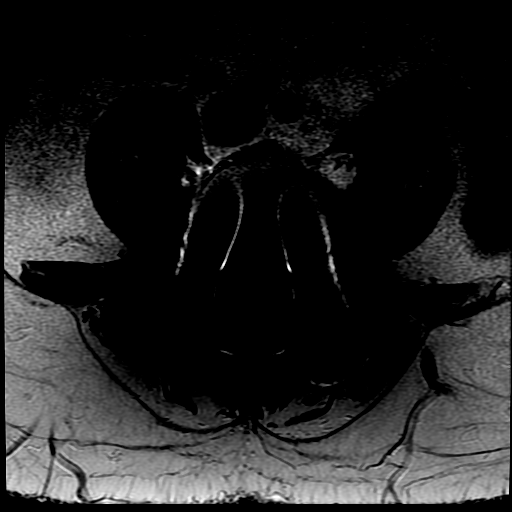
[im 19/35]
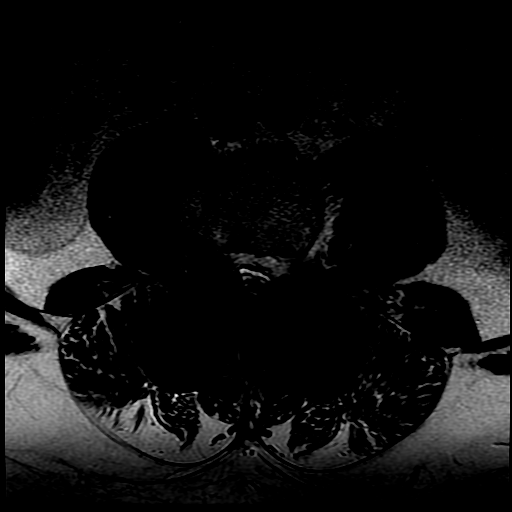
[im 30/35]
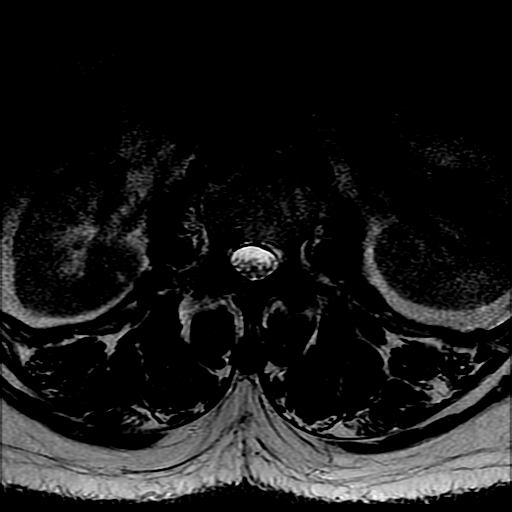

[Series 7: T1 · axial · 4.0mm · 0.39mm/px · z∈[-27,+120]mm · 3 of 35 slices shown (2 of 2)]
[im 5/35]
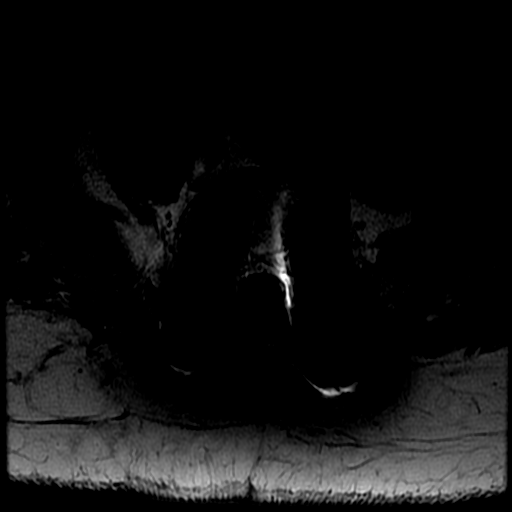
[im 19/35]
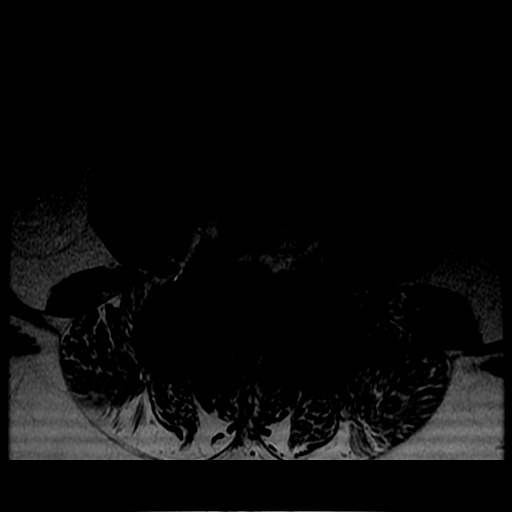
[im 30/35]
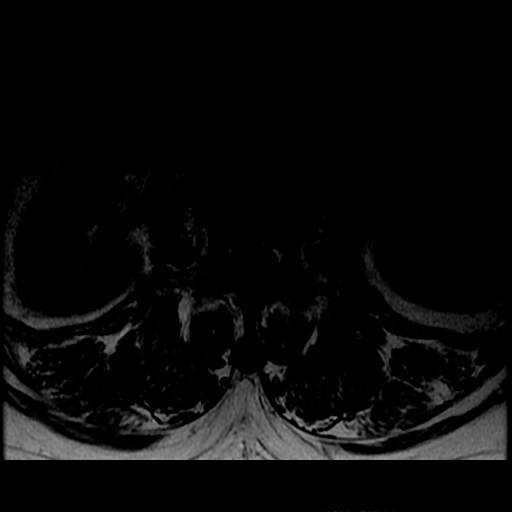

[18 of 48 positions shown; findings below may reference images not displayed]

FINDINGS: Segmentation: Standard segmentation is assumed. The inferior-most
fully formed intervertebral disc labeled L5-S1.

Alignment: Trace anterolisthesis of L3 on L4, similar to prior.
Otherwise, no substantial sagittal subluxation.

Vertebrae: L4-S1 posterior fusion. No specific evidence of acute
fracture discitis/osteomyelitis. Metallic hardware artifact limits
evaluation at the levels of fusion.

Conus medullaris and cauda equina: Conus extends to the L1 level.
Conus appears normal.

Paraspinal and other soft tissues: Unremarkable.

Disc levels:

T12-L1: Mild facet hypertrophy without significant canal or
foraminal stenosis.

L1-L2: Mild disc bulging and moderate facet hypertrophy with
ligamentum flavum thickening. Resulting moderate to severe right and
moderate left foraminal stenosis. Moderate bilateral subarticular
recess narrowing with mild central canal stenosis.

L2-L3: Broad disc bulge with superimposed superiorly dissecting
central disc protrusion. Moderate bilateral facet hypertrophy with
ligamentum flavum thickening. Mildly prominent dorsal epidural fat.
Resulting moderate canal stenosis with right greater than left
subarticular recess narrowing moderate right foraminal stenosis
without significant left foraminal stenosis.

L3-L4: Similar grade 1 anterolisthesis of L3 on L4. Uncovering of
the disc with superimposed left eccentric broad disc bulge. While
metallic artifact limits evaluation, there is suspected moderate to
severe canal and severe subarticular recess stenosis. Moderate to
severe right and moderate left foraminal stenosis.

L4-L5: Nondiagnostic evaluation due to extensive metallic artifact.

L5-S1: Nondiagnostic evaluation due to extensive metallic artifact.
IMPRESSION: 1. L4-S1 posterior fusion with adjacent level degenerative change.
While metallic artifact limits evaluation, there is suspected
moderate to severe canal and severe bilateral subarticular recess
stenosis at L3-L4 and moderate canal stenosis and right greater than
left subarticular recess stenosis at L2-L3. Moderate to severe
foraminal stenosis on the right at L3-L4 and moderate foraminal
stenosis on the right at L2-L3 and the left at L3-L4.
2. Nondiagnostic evaluation at L4-L5 and L5-S1 due to extensive
metallic artifact.

## 2024-03-24 ENCOUNTER — Encounter (HOSPITAL_COMMUNITY): Admission: RE | Payer: Self-pay | Source: Home / Self Care

## 2024-03-24 ENCOUNTER — Ambulatory Visit (HOSPITAL_COMMUNITY): Admission: RE | Admit: 2024-03-24 | Source: Home / Self Care | Admitting: General Surgery

## 2024-03-24 SURGERY — MANOMETRY, ANORECTAL

## 2024-03-24 NOTE — Progress Notes (Signed)
 Patient no show for anorectal manometry appointment; called patient and states he had mixed up the dates. Advised patient to call [facility] to reschedule. Patient verbalized understanding.

## 2024-07-22 ENCOUNTER — Emergency Department (HOSPITAL_COMMUNITY)
Admission: EM | Admit: 2024-07-22 | Discharge: 2024-07-22 | Disposition: A | Discharge status: Home / Self Care | Attending: Emergency Medicine | Admitting: Emergency Medicine

## 2024-07-22 ENCOUNTER — Other Ambulatory Visit: Payer: Self-pay

## 2024-07-22 ENCOUNTER — Emergency Department (HOSPITAL_COMMUNITY)

## 2024-07-22 DIAGNOSIS — R079 Chest pain, unspecified: Secondary | ICD-10-CM | POA: Diagnosis not present

## 2024-07-22 DIAGNOSIS — Z955 Presence of coronary angioplasty implant and graft: Secondary | ICD-10-CM | POA: Diagnosis not present

## 2024-07-22 DIAGNOSIS — R0609 Other forms of dyspnea: Secondary | ICD-10-CM | POA: Insufficient documentation

## 2024-07-22 DIAGNOSIS — I5020 Unspecified systolic (congestive) heart failure: Secondary | ICD-10-CM | POA: Diagnosis not present

## 2024-07-22 DIAGNOSIS — R109 Unspecified abdominal pain: Secondary | ICD-10-CM | POA: Insufficient documentation

## 2024-07-22 DIAGNOSIS — E041 Nontoxic single thyroid nodule: Secondary | ICD-10-CM | POA: Diagnosis not present

## 2024-07-22 DIAGNOSIS — M549 Dorsalgia, unspecified: Secondary | ICD-10-CM | POA: Diagnosis not present

## 2024-07-22 DIAGNOSIS — Z7982 Long term (current) use of aspirin: Secondary | ICD-10-CM | POA: Insufficient documentation

## 2024-07-22 DIAGNOSIS — N390 Urinary tract infection, site not specified: Secondary | ICD-10-CM | POA: Diagnosis not present

## 2024-07-22 DIAGNOSIS — I110 Hypertensive heart disease with heart failure: Secondary | ICD-10-CM | POA: Insufficient documentation

## 2024-07-22 DIAGNOSIS — D72820 Lymphocytosis (symptomatic): Secondary | ICD-10-CM | POA: Insufficient documentation

## 2024-07-22 DIAGNOSIS — R300 Dysuria: Secondary | ICD-10-CM | POA: Diagnosis present

## 2024-07-22 DIAGNOSIS — Z79899 Other long term (current) drug therapy: Secondary | ICD-10-CM | POA: Insufficient documentation

## 2024-07-22 DIAGNOSIS — B9689 Other specified bacterial agents as the cause of diseases classified elsewhere: Secondary | ICD-10-CM | POA: Insufficient documentation

## 2024-07-22 DIAGNOSIS — R911 Solitary pulmonary nodule: Secondary | ICD-10-CM | POA: Insufficient documentation

## 2024-07-22 LAB — CBC WITH DIFFERENTIAL/PLATELET
Abs Immature Granulocytes: 0.12 K/uL — ABNORMAL HIGH (ref 0.00–0.07)
Basophils Absolute: 0.1 K/uL (ref 0.0–0.1)
Basophils Relative: 0 %
Eosinophils Absolute: 0.1 K/uL (ref 0.0–0.5)
Eosinophils Relative: 0 %
HCT: 46 % (ref 39.0–52.0)
Hemoglobin: 15.1 g/dL (ref 13.0–17.0)
Immature Granulocytes: 0 %
Lymphocytes Relative: 52 %
Lymphs Abs: 14 K/uL — ABNORMAL HIGH (ref 0.7–4.0)
MCH: 30.6 pg (ref 26.0–34.0)
MCHC: 32.8 g/dL (ref 30.0–36.0)
MCV: 93.1 fL (ref 80.0–100.0)
Monocytes Absolute: 1.4 K/uL — ABNORMAL HIGH (ref 0.1–1.0)
Monocytes Relative: 5 %
Neutro Abs: 11.9 K/uL — ABNORMAL HIGH (ref 1.7–7.7)
Neutrophils Relative %: 43 %
Platelets: 181 K/uL (ref 150–400)
RBC: 4.94 MIL/uL (ref 4.22–5.81)
RDW: 13.1 % (ref 11.5–15.5)
Smear Review: NORMAL
WBC: 27.6 K/uL — ABNORMAL HIGH (ref 4.0–10.5)
nRBC: 0 % (ref 0.0–0.2)

## 2024-07-22 LAB — URINALYSIS, W/ REFLEX TO CULTURE (INFECTION SUSPECTED)
Bacteria, UA: NONE SEEN
Bilirubin Urine: NEGATIVE
Glucose, UA: >=500 mg/dL — AB
Ketones, ur: NEGATIVE mg/dL
Nitrite: NEGATIVE
Protein, ur: 100 mg/dL — AB
RBC / HPF: >50 RBC/hpf (ref 0–5)
Specific Gravity, Urine: 1.028 (ref 1.005–1.030)
WBC, UA: >50 WBC/hpf (ref 0–5)
pH: 5 (ref 5.0–8.0)

## 2024-07-22 LAB — PATHOLOGIST SMEAR REVIEW

## 2024-07-22 LAB — COMPREHENSIVE METABOLIC PANEL WITH GFR
ALT: 21 U/L (ref 0–44)
AST: 38 U/L (ref 15–41)
Albumin: 3.9 g/dL (ref 3.5–5.0)
Alkaline Phosphatase: 78 U/L (ref 38–126)
Anion gap: 9 (ref 5–15)
BUN: 11 mg/dL (ref 8–23)
CO2: 23 mmol/L (ref 22–32)
Calcium: 9.4 mg/dL (ref 8.9–10.3)
Chloride: 103 mmol/L (ref 98–111)
Creatinine, Ser: 1.15 mg/dL (ref 0.61–1.24)
GFR, Estimated: >60 mL/min
Glucose, Bld: 122 mg/dL — ABNORMAL HIGH (ref 70–99)
Potassium: 3.6 mmol/L (ref 3.5–5.1)
Sodium: 135 mmol/L (ref 135–145)
Total Bilirubin: 1.5 mg/dL — ABNORMAL HIGH (ref 0.0–1.2)
Total Protein: 7.5 g/dL (ref 6.5–8.1)

## 2024-07-22 LAB — TROPONIN T, HIGH SENSITIVITY
Troponin T High Sensitivity: 16 ng/L (ref 0–19)
Troponin T High Sensitivity: <15 ng/L (ref 0–19)

## 2024-07-22 LAB — PRO BRAIN NATRIURETIC PEPTIDE: Pro Brain Natriuretic Peptide: 127 pg/mL

## 2024-07-22 LAB — I-STAT CG4 LACTIC ACID, ED: Lactic Acid, Venous: 0.7 mmol/L (ref 0.5–1.9)

## 2024-07-22 MED ORDER — SODIUM CHLORIDE 0.9 % IV SOLN
1.0000 g | Freq: Once | INTRAVENOUS | Status: AC
  Administered 2024-07-22: 1 g via INTRAVENOUS
  Filled 2024-07-22: qty 10

## 2024-07-22 MED ORDER — CEPHALEXIN 500 MG PO CAPS: 500.0000 mg | ORAL_CAPSULE | Freq: Three times a day (TID) | ORAL | 0 refills | Status: AC

## 2024-07-22 MED ORDER — KETOROLAC TROMETHAMINE 30 MG/ML IJ SOLN
30.0000 mg | Freq: Once | INTRAMUSCULAR | Status: AC
  Administered 2024-07-22: 30 mg via INTRAVENOUS
  Filled 2024-07-22: qty 1

## 2024-07-22 MED ORDER — IOHEXOL 350 MG/ML SOLN
75.0000 mL | Freq: Once | INTRAVENOUS | Status: AC | PRN
  Administered 2024-07-22: 75 mL via INTRAVENOUS

## 2024-07-22 NOTE — ED Provider Notes (Signed)
 " [facility] AT [facility] Provider Note   CSN: [id number] Arrival date & time: 07/22/24  1300     Patient presents with: No chief complaint on file.   [name] is a 69 y.o. male.   HPI       69 year old male with a history of chronic heart failure with reduced ejection fraction, hypertension, chronic leukemia-large granular lymphocytic leukema, hyperlipidemia presents from [place] with concern for blood clot somewhere, after he reported chest pain for 4 days, urinary tract symptoms.   Presents with dyspnea on exertion, exertion related chest pain.  Went to [place] and told to go to [place] as his point-of-care troponin was slightly elevated.  Also reports dysuria. They had said blood clot' but paperwork he has just shows troponin .13    Dyspnea, nausea 4 days ago, slept for about 2 days  Then the bladder infection symptoms started 3 days ago, severe pain with urination, hx of CHF.   Still having some cp and dyspnea now, left side and pain into back, worse with deep breaths  Has abdominal tenderness  No vomiting  No hx of dvt or pe Past Medical History:  Diagnosis Date   Chronic HFrEF (heart failure with reduced ejection fraction) (HCC)    a. 09/2008 Echo: EF 30-40%, mod diff HK. Mildly dil LA.   Depression    Hypertension    Leukemia (HCC)    Mixed hyperlipidemia    NICM (nonischemic cardiomyopathy) (HCC)    A. 09/2008 Echo: EF 30-40%; b. 09/2008 Cath: Nl cors, EF 40%; c. ~ 2018 reportedly nl stress test.   Thyroid nodule     Past Surgical History:  Procedure Laterality Date   BACK SURGERY     LEFT HEART CATH AND CORONARY ANGIOGRAPHY N/A 02/04/2022   Procedure: LEFT HEART CATH AND CORONARY ANGIOGRAPHY;  Surgeon: Wonda Sharper, MD;  Location: [facility] INVASIVE CV LAB;  Service: Cardiovascular;  Laterality: N/A;    Prior to Admission medications  Medication Sig Start Date End Date Taking? Authorizing Provider  cephALEXin  (KEFLEX ) 500 MG capsule  Take 1 capsule (500 mg total) by mouth 3 (three) times daily for 7 days. 07/22/24 07/29/24 Yes Dreama Longs, MD  acetaminophen  (TYLENOL ) 500 MG tablet Take 1,000 mg by mouth every 4 (four) hours as needed for mild pain.    [provider]  aspirin  EC 81 MG tablet Take 81 mg by mouth daily.    [provider]  atorvastatin  (LIPITOR ) 80 MG tablet Take 80 mg by mouth daily.    [provider]  carvedilol  (COREG ) 25 MG tablet Take 12.5 mg by mouth 2 (two) times daily.    [provider]  cetirizine (ZYRTEC) 10 MG tablet Take 10 mg by mouth daily.    [provider]  cholecalciferol (VITAMIN D3) 10 MCG (400 UNIT) TABS tablet Take 400 Units by mouth daily.    [provider]  docusate sodium (COLACE) 100 MG capsule Take 100 mg by mouth 2 (two) times daily.    [provider]  DULoxetine  (CYMBALTA ) 60 MG capsule Take 60 mg by mouth daily.    [provider]  empagliflozin  (JARDIANCE ) 10 MG TABS tablet Take 1 tablet (10 mg total) by mouth daily. 02/05/22   O'NealDarryle Ned, MD  furosemide  (LASIX ) 20 MG tablet Take 1 tablet (20 mg total) by mouth daily. 02/05/22   O'NealDarryle Ned, MD  lisinopril  (ZESTRIL ) 40 MG tablet Take 1 tablet (40 mg total)  by mouth daily. 02/05/22   O'NealDarryle Ned, MD  pantoprazole  (PROTONIX ) 40 MG tablet Take 1 tablet (40 mg total) by mouth daily. 02/05/22   O'NealDarryle Ned, MD  sildenafil (VIAGRA) 100 MG tablet Take 50 mg by mouth daily as needed for erectile dysfunction.    [provider]  spironolactone  (ALDACTONE ) 25 MG tablet Take 25 mg by mouth daily.    [provider]  traMADol  (ULTRAM ) 50 MG tablet Take 50 mg by mouth every 6 (six) hours as needed for moderate pain.    [provider]    Allergies: Penicillins    Review of Systems  Updated Vital Signs BP (!) 125/107   Pulse (!) 56   Temp 98.2 F (36.8 C) (Oral)   Resp 20   Ht 5' 9 (1.753 m)    Wt 97.1 kg   SpO2 100%   BMI 31.60 kg/m   Physical Exam Vitals and nursing note reviewed.  Constitutional:      General: He is not in acute distress.    Appearance: He is well-developed. He is not diaphoretic.  HENT:     Head: Normocephalic and atraumatic.  Eyes:     Conjunctiva/sclera: Conjunctivae normal.  Cardiovascular:     Rate and Rhythm: Normal rate and regular rhythm.     Heart sounds: Normal heart sounds. No murmur heard.    No friction rub. No gallop.  Pulmonary:     Effort: Pulmonary effort is normal. No respiratory distress.     Breath sounds: Normal breath sounds. No wheezing or rales.  Abdominal:     General: There is no distension.     Palpations: Abdomen is soft.     Tenderness: There is abdominal tenderness (suprapubic, LUQ). There is no guarding.  Musculoskeletal:     Cervical back: Normal range of motion.  Skin:    General: Skin is warm and dry.  Neurological:     Mental Status: He is alert and oriented to person, place, and time.     (all labs ordered are listed, but only abnormal results are displayed) Labs Reviewed  COMPREHENSIVE METABOLIC PANEL WITH GFR - Abnormal; Notable for the following components:      Result Value   Glucose, Bld 122 (*)    Total Bilirubin 1.5 (*)    All other components within normal limits  CBC WITH DIFFERENTIAL/PLATELET - Abnormal; Notable for the following components:   WBC 27.6 (*)    Neutro Abs 11.9 (*)    Lymphs Abs 14.0 (*)    Monocytes Absolute 1.4 (*)    Abs Immature Granulocytes 0.12 (*)    All other components within normal limits  URINALYSIS, W/ REFLEX TO CULTURE (INFECTION SUSPECTED) - Abnormal; Notable for the following components:   Color, Urine AMBER (*)    APPearance HAZY (*)    Glucose, UA >=500 (*)    Hgb urine dipstick MODERATE (*)    Protein, ur 100 (*)    Leukocytes,Ua MODERATE (*)    All other components within normal limits  CULTURE, BLOOD (ROUTINE X 2)  CULTURE, BLOOD (ROUTINE X 2)  URINE  CULTURE  PRO BRAIN NATRIURETIC PEPTIDE  PATHOLOGIST SMEAR REVIEW  I-STAT CG4 LACTIC ACID, ED  TROPONIN T, HIGH SENSITIVITY  TROPONIN T, HIGH SENSITIVITY    EKG: EKG Interpretation Date/Time:  Thursday July 22 2024 13:15:57 EST Ventricular Rate:  83 PR Interval:  156 QRS Duration:  102 QT Interval:  346 QTC Calculation: 406 R Axis:   -  47  Text Interpretation: Normal sinus rhythm Possible Left atrial enlargement Left anterior fascicular block Left ventricular hypertrophy ( R in aVL , Cornell product , Romhilt-Estes ) Abnormal ECG When compared with ECG of 01-Feb-2022 06:46, No significant change since last tracing Confirmed by Dreama Longs ([id number]) on 07/22/2024 6:26:07 PM  Radiology: CT ABDOMEN PELVIS W CONTRAST Result Date: 07/22/2024 EXAM: CT ABDOMEN AND PELVIS WITH CONTRAST 07/22/2024 09:36:25 PM TECHNIQUE: CT of the abdomen and pelvis was performed with the administration of intravenous contrast. 75 mL iohexol  (OMNIPAQUE ) 350 MG/ML injection was administered. Multiplanar reformatted images are provided for review. Automated exposure control, iterative reconstruction, and/or weight-based adjustment of the mA/kV was utilized to reduce the radiation dose to as low as reasonably achievable. COMPARISON: Comparison with 02/04/2009. CLINICAL HISTORY: Abdominal pain, acute, nonlocalized; UTI, abdominal pain, chest pAIN AND dyspnea, history of leukemia. FINDINGS: LOWER CHEST: Mild dependent atelectasis in the lung bases. Small esophageal hiatal hernia. LIVER: The liver is unremarkable. GALLBLADDER AND BILE DUCTS: Gallbladder is unremarkable. No biliary ductal dilatation. SPLEEN: No acute abnormality. PANCREAS: No acute abnormality. ADRENAL GLANDS: No acute abnormality. KIDNEYS, URETERS AND BLADDER: Cyst on the left kidney measuring 4.7 cm in diameter. This was present on the prior study with mild interval enlargement over time. No imaging follow-up is indicated. No stones in the kidneys or  ureters. No hydronephrosis or hydroureter. No perinephric or periureteral stranding. The bladder is normal. GI AND BOWEL: Stomach, small bowel, and colon are not abnormally distended. Liquid stool is present in the colon, which may indicate viral infection. Colonic diverticula without evidence of acute diverticulitis. The appendix is not identified. PERITONEUM AND RETROPERITONEUM: No ascites. No free air. VASCULATURE: Aorta is normal in caliber. LYMPH NODES: No lymphadenopathy. REPRODUCTIVE ORGANS: The prostate gland is enlarged. BONES AND SOFT TISSUES: Lumbar spine fixation hardware causes streak artifact obscuring portions of the examination. Degenerative changes in the lumbar spine. Minimal left inguinal hernia containing fat. Scarring in the left groin region may be postoperative. No acute osseous abnormality. No focal soft tissue abnormality. IMPRESSION: 1. No acute findings in the abdomen or pelvis. 2. Liquid stool in the colon, which may indicate viral infection. 3. Colonic diverticula without evidence of acute diverticulitis. 4. No evidence of metastatic disease. 5. Left renal cyst measuring 4.7 cm, present previously with mild interval enlargement over time, with no imaging follow-up indicated. Electronically signed by: Elsie Gravely MD 07/22/2024 09:51 PM EST RP Workstation: [id number]   CT Angio Chest PE W and/or Wo Contrast Result Date: 07/22/2024 EXAM: CTA of the Chest with contrast for PE 07/22/2024 09:36:25 PM TECHNIQUE: CTA of the chest was performed after the administration of intravenous contrast. Multiplanar reformatted images are provided for review. MIP images are provided for review. Automated exposure control, iterative reconstruction, and/or weight based adjustment of the mA/kV was utilized to reduce the radiation dose to as low as reasonably achievable. COMPARISON: CT angiogram chest 10/06/2008. CLINICAL HISTORY: Pulmonary embolism (PE) suspected, high prob. FINDINGS: PULMONARY ARTERIES:  Pulmonary arteries are adequately opacified for evaluation. No pulmonary embolism. Main pulmonary artery is normal in caliber. MEDIASTINUM: The heart and pericardium demonstrate no acute abnormality. There is no acute abnormality of the thoracic aorta. There is an inferior right thyroid nodule/mass measuring 3.5 x 3.8 cm extending into the right paratracheal region causing tracheal deviation to the left and mild narrowing. This has significantly increased in size in the interval. LYMPH NODES: No mediastinal, hilar or axillary lymphadenopathy. LUNGS AND PLEURA: There is a 3 mm right upper  lobe nodule (image 3/50). The lungs are otherwise clear. No focal consolidation or pulmonary edema. No pleural effusion or pneumothorax. UPPER ABDOMEN: There is a small hiatal hernia. There is a cyst in the superior pole of the left kidney. SOFT TISSUES AND BONES: There is bilateral gynecomastia. No acute bone abnormality. IMPRESSION: 1. No evidence of pulmonary embolism. 2. Inferior right thyroid nodule/mass measuring 3.5 x 3.8 cm extending into the right paratracheal region with leftward tracheal deviation and mild narrowing; recommend non-emergent thyroid ultrasound. 3. Incidental 3 mm right upper lobe pulmonary nodule; if high risk for malignancy, optional non-contrast chest CT at 12 months, as per Fleischner Society Guidelines. 4. Small hiatal hernia. Electronically signed by: Greig Pique MD MD 07/22/2024 09:50 PM EST RP Workstation: [id number]     Procedures   Medications Ordered in the ED  cefTRIAXone  (ROCEPHIN ) 1 g in sodium chloride  0.9 % 100 mL IVPB (0 g Intravenous Stopped 07/22/24 1942)  ketorolac  (TORADOL ) 30 MG/ML injection 30 mg (30 mg Intravenous Given 07/22/24 2028)  iohexol  (OMNIPAQUE ) 350 MG/ML injection 75 mL (75 mLs Intravenous Contrast Given 07/22/24 2136)                                    Medical Decision Making Amount and/or Complexity of Data Reviewed Radiology: ordered.  Risk Prescription  drug management.   69 year old male with a history of chronic heart failure with reduced ejection fraction, hypertension, chronic leukemia-large granular lymphocytic leukema, hyperlipidemia presents from [place] with concern for blood clot somewhere, after he reported chest pain for 4 days, urinary tract symptoms.  Differential diagnosis for chest pain includes pulmonary embolus, dissection, pneumothorax, pneumonia, ACS, myocarditis, pericarditis.    EKG was done and evaluate by me and showed no acute ST changes and no signs of pericarditis.   Labs completed and personally about interpreted by me show evidence of urinary tract infection, significant leukocytosis of 27,000 smudge cells, absolute lymphocytosis.  Troponin negative x 2, low suspicion for ACS.  proBNP is negative.  Given cancer history, pleuritic chest pain and dyspnea, report for concern for blood clot from [place], CT PE study was ordered.  Given his abdominal tenderness, presence of urinary tract infection, ordered CT abdomen pelvis to evaluate for signs of perinephric abscess, kidney stone, or other findings related to his leukemia such as increased bone megaly.  His CT PE study showed no evidence of PE, did discuss the incidental findings including thyroid nodule and lung nodule.  CT abdomen pelvis shows no acute significant findings, liquid stool in the colon.  Given rocephin  for UTI.  At this time do not see indication for admission to the hospital in setting of symptoms, and negative troponins, no signs of blood clots.  Given antibiotics with concern for urinary tract infection.  Recommend follow-up with his physician at the [facility] regarding his symptoms, as well as follow-up of his lab work with hematology in the setting of CLL, white count of 29,000 and lymphocytosis to compare to what he had been previously//most recently.  Patient discharged in stable condition with understanding of reasons to return.      Final diagnoses:  Urinary  tract infection without hematuria, site unspecified  Chest pain, unspecified type  Thyroid nodule  Pulmonary nodule  Lymphocytosis    ED Discharge Orders          Ordered    cephALEXin  (KEFLEX ) 500 MG capsule  3 times daily  07/22/24 2214               Dreama Longs, MD 07/23/24 1222  "

## 2024-07-22 NOTE — ED Provider Triage Note (Signed)
 Emergency Medicine Provider Triage Evaluation Note  HOLLAND NICKSON , a 69 y.o. male  was evaluated in triage.  Pt complains of dyspnea on exertion, exertion related chest pain. Went to [place] and told to go to [facility] as his POCT troponin was slightly elevated. Did not go to [facility], went home sx worse so called 911. Also reports dysuria. Triage note mentions c/f blood clot however paperwork from [place] only shows POCT troponin   Review of Systems  Positive: Chest pain, dyspnea, dysuria Negative: Nausea or vomiting, pleuritic pain, diaphoresis, leg swelling   Physical Exam  BP 130/68 (BP Location: Right Arm)   Pulse 86   Temp 99.6 F (37.6 C) (Oral)   Resp 20   Ht 5' 9 (1.753 m)   Wt 97.1 kg   SpO2 100%   BMI 31.60 kg/m  Gen:   Awake, no distress   Resp:  Normal effort  MSK:   Moves extremities without difficulty  Other:    Medical Decision Making  Medically screening exam initiated at 1:20 PM.  Appropriate orders placed.  Cyle A Goldman was informed that the remainder of the evaluation will be completed by another provider, this initial triage assessment does not replace that evaluation, and the importance of remaining in the ED until their evaluation is complete.      Francesca Elsie CROME, MD 07/22/24 1323

## 2024-07-22 NOTE — ED Triage Notes (Signed)
 Pt BIB [facility] from home. At [facility] earlier w 12 lead, blood work, and referred him to [facility] for blood clot somewhere, pt unsure of where. Went home and began having cp. CP x4 days. Possible UTI per pt due to urine color. 12 lead unremarkable  146/82 98 RA 148 CBG 20 L Hand

## 2024-07-22 NOTE — ED Notes (Signed)
 Patient transported to CT

## 2024-07-25 LAB — URINE CULTURE: Culture: 90000 — AB

## 2024-07-26 ENCOUNTER — Telehealth (HOSPITAL_BASED_OUTPATIENT_CLINIC_OR_DEPARTMENT_OTHER): Payer: Self-pay | Admitting: *Deleted

## 2024-07-26 NOTE — Telephone Encounter (Addendum)
 Post ED Visit - Positive Culture Follow-up: Successful Patient Follow-Up  Culture assessed and recommendations reviewed by:  [x]  Sharyne Glatter, Pharm.D. []  Venetia Gully, Pharm.D., BCPS AQ-ID []  Garrel Crews, Pharm.D., BCPS []  Almarie Lunger, Pharm.D., BCPS []  [place], [place].D., BCPS, AAHIVP []  Rosaline Bihari, Pharm.D., BCPS, AAHIVP []  Vernell Meier, PharmD, BCPS []  Latanya Hint, PharmD, BCPS []  Donald Medley, PharmD, BCPS []  Rocky Bold, PharmD  Positive urine culture  []  Patient discharged without antimicrobial prescription and treatment is now indicated [x]  Organism is resistant to prescribed ED discharge antimicrobial []  Patient with positive blood cultures  Changes discussed with ED provider: Bernardino Fireman, MD New antibiotic prescription: Nitrofurantoin 100 mg BID x 7 days Faxed to [facility] in [place]  Contacted patient, date 07/26/24, time 1033   [name] 07/26/2024, 12:42 PM

## 2024-07-27 LAB — CULTURE, BLOOD (ROUTINE X 2)
Culture: NO GROWTH
Culture: NO GROWTH
Special Requests: ADEQUATE

## 2024-08-18 ENCOUNTER — Other Ambulatory Visit (HOSPITAL_BASED_OUTPATIENT_CLINIC_OR_DEPARTMENT_OTHER): Payer: Self-pay | Admitting: Nurse Practitioner

## 2024-08-18 DIAGNOSIS — M5416 Radiculopathy, lumbar region: Secondary | ICD-10-CM

## 2024-11-03 ENCOUNTER — Ambulatory Visit (HOSPITAL_COMMUNITY): Admission: RE | Admit: 2024-11-03 | Source: Home / Self Care | Admitting: General Surgery

## 2024-11-03 ENCOUNTER — Encounter (HOSPITAL_COMMUNITY): Admission: RE | Payer: Self-pay | Source: Home / Self Care

## 2024-11-03 SURGERY — MANOMETRY, ANORECTAL
